# Patient Record
Sex: Female | Born: 1965 | Race: White | Hispanic: No | Smoking: Former smoker
Health system: Southern US, Community
[De-identification: ages and names within clinical notes are randomized; demographics above are authoritative.]

## PROBLEM LIST (undated history)

## (undated) DIAGNOSIS — Z8719 Personal history of other diseases of the digestive system: Secondary | ICD-10-CM

## (undated) DIAGNOSIS — E782 Mixed hyperlipidemia: Secondary | ICD-10-CM

## (undated) DIAGNOSIS — Z789 Other specified health status: Secondary | ICD-10-CM

## (undated) DIAGNOSIS — K219 Gastro-esophageal reflux disease without esophagitis: Secondary | ICD-10-CM

## (undated) DIAGNOSIS — Z973 Presence of spectacles and contact lenses: Secondary | ICD-10-CM

## (undated) DIAGNOSIS — G8929 Other chronic pain: Secondary | ICD-10-CM

## (undated) DIAGNOSIS — F32A Depression, unspecified: Secondary | ICD-10-CM

## (undated) DIAGNOSIS — IMO0001 Reserved for inherently not codable concepts without codable children: Secondary | ICD-10-CM

## (undated) DIAGNOSIS — M1990 Unspecified osteoarthritis, unspecified site: Secondary | ICD-10-CM

## (undated) DIAGNOSIS — M549 Dorsalgia, unspecified: Secondary | ICD-10-CM

## (undated) DIAGNOSIS — I10 Essential (primary) hypertension: Secondary | ICD-10-CM

## (undated) DIAGNOSIS — E039 Hypothyroidism, unspecified: Secondary | ICD-10-CM

## (undated) DIAGNOSIS — K449 Diaphragmatic hernia without obstruction or gangrene: Secondary | ICD-10-CM

## (undated) DIAGNOSIS — D071 Carcinoma in situ of vulva: Secondary | ICD-10-CM

## (undated) DIAGNOSIS — E781 Pure hyperglyceridemia: Secondary | ICD-10-CM

## (undated) DIAGNOSIS — M797 Fibromyalgia: Secondary | ICD-10-CM

## (undated) DIAGNOSIS — F329 Major depressive disorder, single episode, unspecified: Secondary | ICD-10-CM

## (undated) DIAGNOSIS — R0602 Shortness of breath: Secondary | ICD-10-CM

## (undated) HISTORY — PX: LUMBAR FUSION: SHX111

## (undated) HISTORY — PX: CARPAL TUNNEL RELEASE: SHX101

## (undated) HISTORY — PX: ANTERIOR CERVICAL DECOMP/DISCECTOMY FUSION: SHX1161

## (undated) HISTORY — PX: CERVICAL FUSION: SHX112

## (undated) HISTORY — DX: Essential (primary) hypertension: I10

## (undated) HISTORY — DX: Carcinoma in situ of vulva: D07.1

## (undated) HISTORY — PX: ABDOMINAL HYSTERECTOMY: SHX81

## (undated) HISTORY — PX: CHOLECYSTECTOMY: SHX55

## (undated) HISTORY — PX: VAGINAL HYSTERECTOMY: SUR661

## (undated) HISTORY — PX: PARTIAL HYSTERECTOMY: SHX80

## (undated) HISTORY — PX: ULNAR TUNNEL RELEASE: SHX820

## (undated) HISTORY — PX: HEMORROIDECTOMY: SUR656

## (undated) HISTORY — DX: Hypothyroidism, unspecified: E03.9

## (undated) HISTORY — PX: BACK SURGERY: SHX140

## (undated) HISTORY — PX: POSTERIOR LUMBAR FUSION: SHX6036

## (undated) HISTORY — PX: LAMINECTOMY AND MICRODISCECTOMY LUMBAR SPINE: SHX1913

## (undated) HISTORY — PX: LUMBAR SPINE HARDWARE REMOVAL: SHX1987

---
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
## 1998-01-24 ENCOUNTER — Inpatient Hospital Stay (HOSPITAL_COMMUNITY): Admission: AD | Admit: 1998-01-24 | Discharge: 1998-01-24 | Payer: Self-pay | Admitting: Obstetrics & Gynecology

## 1999-10-27 ENCOUNTER — Other Ambulatory Visit: Admission: RE | Admit: 1999-10-27 | Discharge: 1999-10-27 | Payer: Self-pay | Admitting: Gynecology

## 2000-05-12 ENCOUNTER — Inpatient Hospital Stay (HOSPITAL_COMMUNITY): Admission: AD | Admit: 2000-05-12 | Discharge: 2000-05-12 | Payer: Self-pay | Admitting: *Deleted

## 2000-05-18 ENCOUNTER — Inpatient Hospital Stay (HOSPITAL_COMMUNITY): Admission: AD | Admit: 2000-05-18 | Discharge: 2000-05-20 | Payer: Self-pay | Admitting: Obstetrics and Gynecology

## 2000-05-21 ENCOUNTER — Encounter: Admission: RE | Admit: 2000-05-21 | Discharge: 2000-06-22 | Payer: Self-pay | Admitting: Obstetrics and Gynecology

## 2001-06-15 ENCOUNTER — Other Ambulatory Visit: Admission: RE | Admit: 2001-06-15 | Discharge: 2001-06-15 | Payer: Self-pay | Admitting: Family Medicine

## 2002-07-21 ENCOUNTER — Other Ambulatory Visit: Admission: RE | Admit: 2002-07-21 | Discharge: 2002-07-21 | Payer: Self-pay | Admitting: Family Medicine

## 2002-08-10 HISTORY — PX: HEMORROIDECTOMY: SUR656

## 2002-08-10 HISTORY — PX: LAPAROSCOPIC CHOLECYSTECTOMY: SUR755

## 2002-09-07 ENCOUNTER — Ambulatory Visit (HOSPITAL_COMMUNITY): Admission: RE | Admit: 2002-09-07 | Discharge: 2002-09-07 | Payer: Self-pay | Admitting: Obstetrics & Gynecology

## 2002-09-07 ENCOUNTER — Encounter: Payer: Self-pay | Admitting: Obstetrics & Gynecology

## 2002-12-01 ENCOUNTER — Encounter (INDEPENDENT_AMBULATORY_CARE_PROVIDER_SITE_OTHER): Payer: Self-pay

## 2002-12-01 ENCOUNTER — Inpatient Hospital Stay (HOSPITAL_COMMUNITY): Admission: RE | Admit: 2002-12-01 | Discharge: 2002-12-03 | Payer: Self-pay | Admitting: Obstetrics & Gynecology

## 2002-12-05 ENCOUNTER — Ambulatory Visit: Admission: RE | Admit: 2002-12-05 | Discharge: 2002-12-05 | Payer: Self-pay | Admitting: Obstetrics & Gynecology

## 2003-05-16 ENCOUNTER — Encounter: Admission: RE | Admit: 2003-05-16 | Discharge: 2003-06-15 | Payer: Self-pay | Admitting: Orthopaedic Surgery

## 2003-08-07 ENCOUNTER — Encounter: Admission: RE | Admit: 2003-08-07 | Discharge: 2003-09-25 | Payer: Self-pay | Admitting: Neurology

## 2003-11-08 ENCOUNTER — Ambulatory Visit (HOSPITAL_COMMUNITY): Admission: RE | Admit: 2003-11-08 | Discharge: 2003-11-08 | Payer: Self-pay | Admitting: Orthopedic Surgery

## 2003-11-28 ENCOUNTER — Encounter: Admission: RE | Admit: 2003-11-28 | Discharge: 2003-12-27 | Payer: Self-pay | Admitting: Orthopedic Surgery

## 2004-01-11 ENCOUNTER — Encounter
Admission: RE | Admit: 2004-01-11 | Discharge: 2004-04-10 | Payer: Self-pay | Admitting: Physical Medicine & Rehabilitation

## 2004-04-10 ENCOUNTER — Encounter
Admission: RE | Admit: 2004-04-10 | Discharge: 2004-07-09 | Payer: Self-pay | Admitting: Physical Medicine & Rehabilitation

## 2004-04-15 ENCOUNTER — Ambulatory Visit: Payer: Self-pay | Admitting: Anesthesiology

## 2004-05-05 ENCOUNTER — Encounter: Admission: RE | Admit: 2004-05-05 | Discharge: 2004-07-09 | Payer: Self-pay | Admitting: Family Medicine

## 2004-07-23 ENCOUNTER — Encounter: Admission: RE | Admit: 2004-07-23 | Discharge: 2004-07-23 | Payer: Self-pay | Admitting: Orthopedic Surgery

## 2004-08-10 HISTORY — PX: CARPAL TUNNEL RELEASE: SHX101

## 2004-08-13 ENCOUNTER — Encounter: Admission: RE | Admit: 2004-08-13 | Discharge: 2004-08-13 | Payer: Self-pay | Admitting: Orthopedic Surgery

## 2005-01-27 ENCOUNTER — Emergency Department (HOSPITAL_COMMUNITY): Admission: EM | Admit: 2005-01-27 | Discharge: 2005-01-28 | Payer: Self-pay | Admitting: *Deleted

## 2005-04-21 ENCOUNTER — Ambulatory Visit: Payer: Self-pay | Admitting: Family Medicine

## 2005-05-14 ENCOUNTER — Encounter (HOSPITAL_COMMUNITY): Admission: RE | Admit: 2005-05-14 | Discharge: 2005-06-13 | Payer: Self-pay | Admitting: Rheumatology

## 2005-05-21 ENCOUNTER — Encounter (HOSPITAL_COMMUNITY): Admission: RE | Admit: 2005-05-21 | Discharge: 2005-06-20 | Payer: Self-pay | Admitting: Family Medicine

## 2005-06-03 ENCOUNTER — Ambulatory Visit: Payer: Self-pay | Admitting: Family Medicine

## 2005-09-03 ENCOUNTER — Encounter: Admission: RE | Admit: 2005-09-03 | Discharge: 2005-09-03 | Payer: Self-pay | Admitting: Neurosurgery

## 2005-09-21 ENCOUNTER — Inpatient Hospital Stay (HOSPITAL_COMMUNITY): Admission: RE | Admit: 2005-09-21 | Discharge: 2005-09-22 | Payer: Self-pay | Admitting: Neurosurgery

## 2005-11-02 ENCOUNTER — Ambulatory Visit: Payer: Self-pay | Admitting: Family Medicine

## 2006-03-08 ENCOUNTER — Encounter: Admission: RE | Admit: 2006-03-08 | Discharge: 2006-04-20 | Payer: Self-pay | Admitting: Orthopedic Surgery

## 2006-04-29 ENCOUNTER — Ambulatory Visit: Payer: Self-pay | Admitting: Family Medicine

## 2006-05-11 ENCOUNTER — Ambulatory Visit: Payer: Self-pay | Admitting: Family Medicine

## 2006-06-01 ENCOUNTER — Ambulatory Visit: Payer: Self-pay | Admitting: Family Medicine

## 2006-06-21 ENCOUNTER — Ambulatory Visit: Payer: Self-pay | Admitting: Family Medicine

## 2006-07-11 ENCOUNTER — Emergency Department (HOSPITAL_COMMUNITY): Admission: EM | Admit: 2006-07-11 | Discharge: 2006-07-11 | Payer: Self-pay | Admitting: Emergency Medicine

## 2006-07-12 ENCOUNTER — Emergency Department (HOSPITAL_COMMUNITY): Admission: EM | Admit: 2006-07-12 | Discharge: 2006-07-12 | Payer: Self-pay | Admitting: Emergency Medicine

## 2006-08-23 ENCOUNTER — Emergency Department (HOSPITAL_COMMUNITY): Admission: EM | Admit: 2006-08-23 | Discharge: 2006-08-23 | Payer: Self-pay | Admitting: Emergency Medicine

## 2006-09-03 ENCOUNTER — Emergency Department (HOSPITAL_COMMUNITY): Admission: EM | Admit: 2006-09-03 | Discharge: 2006-09-03 | Payer: Self-pay | Admitting: Emergency Medicine

## 2006-09-20 ENCOUNTER — Encounter: Admission: RE | Admit: 2006-09-20 | Discharge: 2006-09-20 | Payer: Self-pay | Admitting: Orthopedic Surgery

## 2006-10-16 ENCOUNTER — Ambulatory Visit (HOSPITAL_COMMUNITY): Admission: RE | Admit: 2006-10-16 | Discharge: 2006-10-16 | Payer: Self-pay | Admitting: Neurosurgery

## 2006-11-28 ENCOUNTER — Encounter: Admission: RE | Admit: 2006-11-28 | Discharge: 2006-11-28 | Payer: Self-pay | Admitting: Neurosurgery

## 2006-12-08 ENCOUNTER — Inpatient Hospital Stay (HOSPITAL_COMMUNITY): Admission: RE | Admit: 2006-12-08 | Discharge: 2006-12-13 | Payer: Self-pay | Admitting: Neurosurgery

## 2007-01-04 ENCOUNTER — Emergency Department (HOSPITAL_COMMUNITY): Admission: EM | Admit: 2007-01-04 | Discharge: 2007-01-04 | Payer: Self-pay | Admitting: Family Medicine

## 2007-01-11 ENCOUNTER — Encounter: Admission: RE | Admit: 2007-01-11 | Discharge: 2007-01-11 | Payer: Self-pay | Admitting: Neurosurgery

## 2007-04-20 ENCOUNTER — Emergency Department (HOSPITAL_COMMUNITY): Admission: EM | Admit: 2007-04-20 | Discharge: 2007-04-20 | Payer: Self-pay | Admitting: Emergency Medicine

## 2007-05-11 ENCOUNTER — Emergency Department (HOSPITAL_COMMUNITY): Admission: EM | Admit: 2007-05-11 | Discharge: 2007-05-11 | Payer: Self-pay | Admitting: Emergency Medicine

## 2007-05-31 ENCOUNTER — Encounter: Admission: RE | Admit: 2007-05-31 | Discharge: 2007-05-31 | Payer: Self-pay | Admitting: Neurosurgery

## 2007-09-29 ENCOUNTER — Encounter: Admission: RE | Admit: 2007-09-29 | Discharge: 2007-09-29 | Payer: Self-pay | Admitting: Neurosurgery

## 2007-12-25 ENCOUNTER — Emergency Department (HOSPITAL_COMMUNITY): Admission: EM | Admit: 2007-12-25 | Discharge: 2007-12-25 | Payer: Self-pay | Admitting: Emergency Medicine

## 2008-07-08 ENCOUNTER — Emergency Department (HOSPITAL_COMMUNITY): Admission: EM | Admit: 2008-07-08 | Discharge: 2008-07-08 | Payer: Self-pay | Admitting: Emergency Medicine

## 2008-07-24 ENCOUNTER — Encounter: Admission: RE | Admit: 2008-07-24 | Discharge: 2008-07-24 | Payer: Self-pay | Admitting: Neurosurgery

## 2008-08-10 HISTORY — PX: ULNAR TUNNEL RELEASE: SHX820

## 2009-02-25 ENCOUNTER — Emergency Department (HOSPITAL_COMMUNITY): Admission: EM | Admit: 2009-02-25 | Discharge: 2009-02-25 | Payer: Self-pay | Admitting: Emergency Medicine

## 2009-02-27 ENCOUNTER — Encounter
Admission: RE | Admit: 2009-02-27 | Discharge: 2009-02-27 | Payer: Self-pay | Admitting: Physical Medicine and Rehabilitation

## 2009-09-26 ENCOUNTER — Encounter: Admission: RE | Admit: 2009-09-26 | Discharge: 2009-09-26 | Payer: Self-pay | Admitting: Neurosurgery

## 2009-10-02 ENCOUNTER — Inpatient Hospital Stay (HOSPITAL_COMMUNITY): Admission: RE | Admit: 2009-10-02 | Discharge: 2009-10-03 | Payer: Self-pay | Admitting: Neurosurgery

## 2010-08-30 ENCOUNTER — Encounter: Payer: Self-pay | Admitting: General Surgery

## 2010-08-31 ENCOUNTER — Encounter: Payer: Self-pay | Admitting: Neurosurgery

## 2010-08-31 ENCOUNTER — Encounter: Payer: Self-pay | Admitting: Orthopedic Surgery

## 2010-10-29 LAB — CBC
Platelets: 154 10*3/uL (ref 150–400)
WBC: 9.4 10*3/uL (ref 4.0–10.5)

## 2010-10-29 LAB — SURGICAL PCR SCREEN: Staphylococcus aureus: NEGATIVE

## 2010-11-16 LAB — URINALYSIS, ROUTINE W REFLEX MICROSCOPIC
Protein, ur: NEGATIVE mg/dL
Urobilinogen, UA: 0.2 mg/dL (ref 0.0–1.0)

## 2010-12-26 NOTE — Discharge Summary (Signed)
   NAME:  [name], [name]                         ACCOUNT NO.:  [id number]   MEDICAL RECORD NO.:  [id number]                   PATIENT TYPE:  INP   LOCATION:  9134                                 FACILITY:  [facility]   PHYSICIAN:  Roseanna Rainbow, M.D.         DATE OF BIRTH:  1965-10-22   DATE OF ADMISSION:  12/01/2002  DATE OF DISCHARGE:  12/03/2002                                 DISCHARGE SUMMARY   CHIEF COMPLAINT:  The patient is a 45 year old Caucasian female with  secondary dysmenorrhea refractive to medical management who presents for a  diagnostic laparoscopy and total vaginal hysterectomy.  Please see the  dictated History and Physical for further details.   HOSPITAL COURSE:  The patient was admitted, underwent a diagnostic  laparoscopy and total vaginal hysterectomy.  Please see the dictated  Operative Summary for further details.  Her postoperative course was  uneventful and she was discharged to home on postoperative day #2.   DISCHARGE DIAGNOSIS:  Secondary dysmenorrhea with adenomyosis.   PROCEDURE:  Diagnostic laparoscopy and total vaginal hysterectomy.   CONDITION:  Stable.   DIET:  Regular.   ACTIVITY:  Per the instruction booklet.   MEDICATIONS:  1. Resume home medications.  2. Percocet one to two tablets q.4-6h. as needed.  3. Colace one tablet p.o. daily as needed.   DISPOSITION:  The patient will follow up in the office in six weeks.                                               Roseanna Rainbow, M.D.    [name]  D:  01/23/2003  T:  01/23/2003  Job:  [id number]

## 2010-12-26 NOTE — Group Therapy Note (Signed)
MEDICAL RECORD NUMBER:  [id number]   DATE OF BIRTH:  05-Jul-1966   HISTORY:  A 45 year old female who has had some intermittent neck and back  pain over the years, but noticed increasing problems with pain shooting down  the last arm starting last summer.  The patient was seen by her primary care  physician and sent over to neurology.  She had neurological evaluation for  left arm pain, neck pain, and tingling with an EMG ordered.  I do not have  the results of the EMG.  Cervical spine MRI did demonstrate left C6-7 disk  impinging on C7 nerve root.  There was some facet arthropathy at that level  with disk osteophyte complex.  She was treated with Ultram which has been  helpful for her pain and also with ibuprofen and Flexeril.  She has gone  through physical therapy, the last time about two to three months ago.  She  has had this in regards to her neck pain primarily and this has helped some  of the shooting pains down the arm.  In fact, this is more of an  intermittent problem.  Her bigger concern at this point is her back pain,  which she notes mainly when she is lifting and carrying her toddler, lifting  up feed bags, gardening, and vacuuming.  She has a very active lifestyle.  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  She does gardening, as well as lifting feed bags, feeding geese  and chickens, as well as several dogs.   MRI was done on November 08, 2003.  It showed minimal disk bulge at L5-S1,  facet degeneration, left greater than right, and also some L4-5 degenerative  disk disease and facet arthropathy at that level.   She was also seen by Dr. August Saucer for a right lateral epicondylitis that  responded well to injection.  She also received Ultram.  She also had a  hamstring tear on the left after wrestling around with her boyfriend.   Her past medical history is significant for a hysterectomy for endometriosis  and adenomyosis in April  of 2004.  She has had septic gallbladder with  cholecystectomy in June of 2004 and hemorrhoidectomy in November of 2004.  The hemorrhoids came on while pregnant.   Going back to her back pain, she thought that this came on the first time  during pregnancy.   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  Fannie Knee from orthopedics.  She has had a left wrist injury in the past and has  occasional left wrist pain.   FAMILY MEDICAL HISTORY:  Significant for cancer in father and sister and  diabetes in aunt and cousin.  Her father died of bladder cancer.  A sister  has leukemia.   MEDICATIONS:  She ran out of Ultram a couple of weeks and really missed this  after she planted her garden.  She continues on Flexeril 10 mg t.i.d. and  ibuprofen 600 mg b.i.d. and p.r.n.  She takes multivitamins.   ALLERGIES:  1. ULTRACET.  2. DARVOCET.  3. GRASS.  4. DUST.  5. POLLEN.  6. RABBIT HAIR.  7. MILDEW.   DRUG SENSITIVITIES:  GI upset with Volteran, excessive ibuprofen, Naprosyn,  aspirin, Aleve, Orudis, and Anaprox.   CURRENT PAIN LEVEL:  Ranging from 5-7/10.   VOCATIONAL:  She last was employed in December of 2003.  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  Since August, she has  been enrolled in classes in medical terminology.   REVIEW OF SYSTEMS:  Positive for occasional reflux, heartburn, constipation,  weakness in the left arm greater than right with numbness.  This is  intermittent.  Spasms in the legs.  This is intermittent.  Anxiety  occasionally.  Poor sleep.  No depression or suicidal thoughts.   PHYSICAL EXAMINATION:  Blood pressure 123/78, pulse 119, but this was done  right after she was late running over to this appointment, respirations 24,  O2 saturation 99% on room air.   In no acute distress.  Mood and affect appropriate.  Her neck has full range  of motion.  Spurling sign is negative.  She has mild tenderness in the  upper  trapezius.  She has full strength, 5/5, in bilateral deltoids, biceps,  triceps, and grip, as well as hip flexion, knee extension, and ankle  dorsiflexion.   She has full range of motion in bilateral upper and lower extremities.  She  has tenderness to palpation in the right lateral epicondyle, but otherwise  no pain to palpation in bilateral upper and lower extremities.  She has  normal sensation in bilateral upper extremities, except for the first dorsal  web space on the left side.  The middle finger is difficult to evaluate on  the left side because of a wasp sting from yesterday causing  hypersensitivity to touch.   The lower extremities have equal sensation.   The back has good forward flexion of 75% with no pain, but coming back up  she has pain in the PSIS area.  She does have pain to palpation in the L5  paraspinals, as well as right PSIS and right gluteus medius.   IMPRESSION:  1. Cervicalgia with radiculopathy.  This is intermittent and generally     improved after physical therapy.  She did well with traction and may     benefit from a home traction unit.  Will send physical therapy for this.  2. Chronic low back pain.  Evidence of degenerative disk disease.  May have     left SI joint arthropathy as well given that this started during     pregnancy and is centered at the PSIS area and left buttock.  Could     benefit from diagnostic left SI joint injection.  3. History of right lateral epicondylitis.  This is generally better, just     tender to palpation, and occasionally with prolonged writing.  4. Insomnia related to low back pain.   PLAN:  1. As noted above, will send to the [facility] outpatient therapy satellite     in [place], [place], to evaluate for cervical traction home unit.  2. Left SI joint injection under fluoroscopic guidance.  3. Resume Ultram 50 mg one p.o. t.i.d. 4. Lidoderm patch over left PSIS area on q.h.s. and off q.a.m.  5.  Consider hydrocodone one q.h.s. given that Ultram causes the patient to     sleep more poorly if taken at h.s.  6. Consider gabapentin, although she has tried this in the past with some     sedation side effect.  7. I will see her back in approximately two weeks.     Erick Colace, M.D.   AEK/MedQ  D:  01/14/2004 16:58:03  T:  01/14/2004 18:58:07  Job #:  [id number]   cc:   G.  Dorene Grebe, M.D.  Fax: [phone]   Gustavus Messing. Orlin Hilding, M.D.  [address]  [address]  [place]  [place] [address]  Fax: [phone]   Magnus Sinning. Dimple Casey, M.D.  [address] [place]  [place] [address]  Fax: [phone]   [facility] [place], [place]

## 2010-12-26 NOTE — Discharge Summary (Signed)
NAMEJALIYA, [name]               ACCOUNT NO.:  [id number]   MEDICAL RECORD NO.:  [id number]          PATIENT TYPE:  INP   LOCATION:  3014                         FACILITY:  [facility]   PHYSICIAN:  Donalee Citrin, M.D.        DATE OF BIRTH:  02-09-1966   DATE OF ADMISSION:  12/08/2006  DATE OF DISCHARGE:  12/13/2006                               DISCHARGE SUMMARY   ADMITTING DIAGNOSIS:  Degenerative disk disease L4-5.   PROCEDURE:  Posterior lumbar interbody fusion L4-5.   HOSPITAL COURSE:  The patient was admitted and on the same day went to  the operating room and underwent the aforementioned procedure.  Postoperatively the patient did very well.  Had a lot of difficulty with  pain control, mostly in her back.  Her leg pain was significantly  improved.  On the floor, the patient continued to have some difficulty  mobilizing and was very slow to mobilize with pain control.  However,  over the next couple of days with physical increase and therapy, her  pain became much better.  It was controlled on oral narcotics, as well  muscle relaxers, one she was weaned off her IV pain medication.  Her  Foley was able to be taken out.  She was voiding spontaneously.  Her  drain was discontinued.  She was able to be discharged home on hospital  day #6 with scheduled followup in 2 weeks.           ______________________________  Donalee Citrin, M.D.     GC/MEDQ  D:  01/24/2007  T:  01/24/2007  Job:  [id number]

## 2010-12-26 NOTE — Op Note (Signed)
NAMEDELMI, FULFER NO.:  [id number]   MEDICAL RECORD NO.:  [id number]          PATIENT TYPE:  INP   LOCATION:  2899                         FACILITY:  [facility]   PHYSICIAN:  Donalee Citrin, M.D.        DATE OF BIRTH:  12/16/1965   DATE OF PROCEDURE:  12/08/2006  DATE OF DISCHARGE:                               OPERATIVE REPORT   PREOPERATIVE DIAGNOSIS:  Degenerative disk disease with history of  mechanical low back pain, L4-L5, with bilateral L4 and L5 radiculopathy.   PROCEDURE PERFORMED:  Decompressive laminectomy and posterior lumbar  interbody fusion, L4-L5, with hybrid Telamon PEEK 8 x 22 cage packed  with locally harvested autograft mixed with Progenics bone substitute as  well as a Tangent 8 x 26 mm allograft wedge; pedicle screw fixation, L4-  L5, using the 6.35 Legacy pedicle screw system; posterolateral  arthrodesis, L4-L5, using locally harvested allograft mixed with  Progenics bone substitute.   SURGEON:  Donalee Citrin, M.D.   ANESTHESIA:  General endotracheal.   HISTORY OF PRESENT ILLNESS:  The patient is a 45 year old female who has  had longstanding back and bilateral leg pain radiating to the front of  her shin as well as the top of the foot and the big toe.  This has been  refractory to all forms of conservative treatment and has been  progressive over the last several years.  She had back pain much greater  than leg pain.  She had an MRI scan that showed motor changes at L4-L5,  severe degenerative collapse, central disk herniation, and biforaminal  stenosis of the L4 and L5 nerve roots.  Due to the patient's failure of  conservative treatment, MRI scan findings and physical examination, the  patient was recommended decompressive laminectomy and posterior lumbar  interbody fusion at this level.  The risks and benefits of above have  been explained to the patient and she agreed to proceed forward.   DESCRIPTION OF PROCEDURE:  The patient was brought  to the operating  room, was induced under general anesthesia and positioned prone.  Once  back was prepped and draped in the usual fashion and once we localized  the L4-L5 disk space, a midline incision was made after infiltration  with 10 mL lidocaine with epinephrine.  Bovie electrocautery was used to  separate out the dissection at the level of L4 and L5, exposing the TPs  at L4 and L5 bilaterally.  Intraoperative x-ray confirmed localization  at the appropriate level.  Then complete central decompression and  complete medial facetectomies were performed with the Leksell rongeur  and 3 and 4 mm Kerrison punch.  There was a marked facet arthropathy and  diastasis of the facet joints causing marked stenosis on predominantly  the 5 root, but also superiorly in 4 root.  The 4 root was noted to be  markedly stenotic due to degenerative collapse.  This was all  underbitten.  After complete medial facetectomies, both foraminotomies  on both sides were completely decompressing the 4 and the 5 nerve roots.   Attention was first taken to  pedicle screw placement.  Using a high  speed drill fluoroscopy, pilot holes were drilled at L4 on the left,  cannulated with the awl, probed, tapped with a 5-5 tap, probed again,  and six 5 x 45 screws were inserted, starting on the left at L4.  This  was inspected from within the pedicles as well as within the canal.  Fluoroscopy confirmed position and trajectory, confirming no medial  lateral breaching of the pedicle at all.   At L5, the screw in the left was inserted in similar fashion, as well as  the L4 and L5 screws on the right, with six 5 x 40 screws inserted at L5  and 6 x 45s at L4.  After all points of _________ interbody work, the  disk was noted to be remarkably collapsed, barely accepting the #15  blade scalpel.  A _______ to reflect the right L5 nerve root medially.  Then a #7 distractor was used to enter the disk space and distract it  open.   The disk was then cleaned out from the left side.  Then a #8  distractor was inserted on the left side, removing the #7 in the right.  Fluoroscopy confirmed this to be appropriate sizing for the graft.  So  two grafts were opened with an 8 x 22 Telamon cage and an 8 x 26 Tangent  allograft wedge.  Using a size 8 cutter and chisel, the interbody was  prepared on the right side.  The Tangent allograft was inserted on the  right side.  Then the distractor was removed from the left.  Again, in a  similar fashion, the interbody space was cleaned out.  Several large  fibrous disks were removed from the central compartment as well as the  lateral compartment underneath the four roots.   At the end of the diskectomy, locally harvested autograft was packed  against the right-sided Tangent.  Then the left-sided Telamon cage  packed with Progenics mixed with locally harvested autograft was  inserted in the left side, both 2-3 mm deep to the posterior vertebra  bilaterally.  After all the interbody work had been done and placement  was confirmed with fluoroscopy, the wound was copiously irrigated.  Meticulous hemostasis was obtained.  Aggressive decortication was  carried out at the TPs and lateral gutters at L4-L5.  Locally harvested  autograft was packed in the lateral gutters on the outsides of the  screws.  Then 4 mm rods were inserted, the _______ was tightened down at  L5.  The L4 pedicle screws were compressed against L5.  A 4 x 22  crosslink was then inserted.  The neuroforamina were all re-explored  prior to placing the crosslink and noted to be widely patent and  confirmed no migration of graft material in the interspace.  Then, after  the crosslink had been inserted, Gelfoam was on laid on top of the dura.  A medium Hemovac drain was threaded under the crosslink.  Posterior  fluoroscopy confirmed good position of rods, screws and bone grafts. Then the wound was closed in layers with  interrupted Vicryl; and the  skin was closed with running 4-0 subcuticular benzoin and Steri-Strips.  The patient was transported to the recovery room in stable condition.  At the end of the case, instrument and sponge counts were correct.           ______________________________  Donalee Citrin, M.D.     GC/MEDQ  D:  12/08/2006  T:  12/08/2006  Job:  [id number]

## 2010-12-26 NOTE — Procedures (Signed)
NAME:  [name], [name]                         ACCOUNT NO.:  [id number]   MEDICAL RECORD NO.:  [id number]                   PATIENT TYPE:   LOCATION:                                       FACILITY:  [facility]   PHYSICIAN:  Erick Colace, M.D.           DATE OF BIRTH:  [birth year]   DATE OF PROCEDURE:  04/08/2004  DATE OF DISCHARGE:                                 OPERATIVE REPORT   PROCEDURE:  Left sacroiliac joint injection.   PHYSICIAN:  Erick Colace, M.D.   INDICATIONS FOR PROCEDURE:  Left buttock pain, positive fabere, SI joint  area.  Previous temporary relief with left SI joint injection.   INFORMED CONSENT:  An informed consent was obtained, after a discussion of  the risks and benefits of the procedure with the patient.  She elects to  proceed.   DESCRIPTION OF PROCEDURE:  The patient is placed prone on the fluoroscopy  table.  A Betadine prep and sterile drape.  A 1.25 inch, #25 gauge needle  was used to anesthetize the skin and subcutaneous tissues with 1% lidocaine  x3 mL.  Then a #22 gauge, 3.5 inch spinal needle was inserted under  fluoroscopic guidance into the left SI joint.  Then 0.5 mL of Omnipaque 180  demonstrated the joint.  Then a solution containing 0.5 mL of 2% lidocaine  and 0.5 mL of 40 mg per mL of Kenalog was injected.  The patient tolerated  the procedure well.  Post-injection instructions were given.  Medications were reviewed with the patient.  She received some from Dr. Burnard Bunting.  Discussed that she has a controlled substance agreement with  this clinic.  She agrees to abide.  She will hold off on her Medrol Dosepak  for one week, until after the effects of this injection are assessed.  I  have given her a prescription for Tylenol #3, #60, one p.o. b.i.d.                                                Erick Colace, M.D.    AEK/MEDQ  D:  04/08/2004 12:59:07  T:  04/08/2004 14:53:28  Job:  [id number]   cc:   G. Dorene Grebe,  M.D.  Fax: [phone]

## 2010-12-26 NOTE — Op Note (Signed)
NAMEKYIAH, CANEPA               ACCOUNT NO.:  [id number]   MEDICAL RECORD NO.:  [id number]          PATIENT TYPE:  OIB   LOCATION:  3013                         FACILITY:  [facility]   PHYSICIAN:  Donalee Citrin, M.D.        DATE OF BIRTH:  1966-05-26   DATE OF PROCEDURE:  09/21/2005  DATE OF DISCHARGE:                                 OPERATIVE REPORT   PREOPERATIVE DIAGNOSIS:  C6 radiculopathy, right and C7 radiculopathy, left  from cervical spondylosis with spondylitic compression of C6 and C7 nerve  roots respectively.   POSTOPERATIVE DIAGNOSIS:  C6 radiculopathy, right and C7 radiculopathy, left  from cervical spondylosis with spondylitic compression of C6 and C7 nerve  roots respectively.   OPERATION PERFORMED:  Anterior cervical diskectomy and fusion at C5-6 and C6-  7 using a 5 mm LifeNet wedge at C5-6 and 6 mm LifeNet wedge at C6-7 and a 40  mm Atlantis Vision plate and six 13 mm variable angle screws.   SURGEON:  Donalee Citrin, M.D.   ASSISTANT:  __________.   ANESTHESIA:  General endotracheal.   INDICATIONS FOR PROCEDURE:  The patient is a very pleasant 45 year old  female who has had longstanding neck and bilateral arm pain predominantly on  the left side on the first two fingers of the left hand but also  occasionally into the right arm and into the thumb.  Preoperative imaging  with MRI and myelography showed spondylitic compression of the C6 nerve root  on the right and C7 nerve root on the left.  The patient had failed all  forms of conservative treatment over the last several years with physical  therapy, anti-inflammatories, facet injections, and time.  The patient was  subsequently recommended anterior cervical diskectomy and fusion.  I  extensively went over the risks and benefits of surgery with her.  She  understands and agreed to proceed forward.   DESCRIPTION OF PROCEDURE:  The patient was brought to the operating room was  induced under general anesthesia,  placed supine with neck in slight  extension with five pounds of halter traction.  The right side of the neck  was prepped and draped in the usual sterile fashion after preoperative x-ray  localized the C6-7 disk space.  A curvilinear incision was made just off the  midline to anterior border of the sternocleidomastoid muscle and the  superficial layer of the platysmas was dissected out and divided  longitudinally.  The avascular plane between the sternocleidomastoid muscle  and the strap muscles was developed down to prevertebral fascia.  The  prevertebral fascia was incised with Kitners.  Then intraoperative x-ray  confirmed localization of the C5-6 disk space.  This was subsequently marked  with a 15 blade scalpel to remove the anterior margin of the annulus.  Then  the longus colli was reflected laterally and the self-retaining retractor  was placed.  Annulotomies were extended to C5-6 and placed in C6-7 and then  the anterior margin of the disk space was removed in piecemeal fashion with  pituitary rongeurs and upgoing curets.  Then using a 2  and 3 mm Kerrison  punches, large anterior osteophytes were bitten off the C5 and C6 vertebral  bodies and the end plates were squared off with a high speed drill down to  the posterior annulus and posterior longitudinal ligament.  At this point  the operating microscope was draped and brought into the field and under  microscopic illumination, first at C6-7 the posterior annulus was removed in  piecemeal fashion with 1 mm Kerrison punch and the osteophytic complex  coming off the posterior aspect of the C7 and C6 vertebral bodies were  underbitten exposing the posterior longitudinal ligament __________ thecal  sac.  At this point the osteophyte coming off the C5 vertebral body was  immediately visualized compressing the proximal aspect of the C7 neural  foramina on the left side.  This was all underbitten and removed and the C7  nerve root was  immediately identified, skeletonized out the foramen and  decompressed at the level of the pedicle.  The pedicle was then palpated  with a black nerve hook and neural foramen was widely opened up and  confirmed with hook.  Then the end plates were squared off marching to the  right side, decompressed the proximal right C7 neural foramen.  The end  plates were scraped with VA curet and Gelfoam was placed in the disk space.  Attention was turned to the disk space above at this point.  Again using  high speed drill, the posterior annulus and posterior longitudinal ligament  was drilled down.  Osteophytic complex __________ C5 vertebral body and C6  vertebral body were underbitten exposing the thecal sac and proximal left C6  nerve root.  Then attention was turned to the margin of the right, again  marked uncinate hypertrophy was noted to be compressing proximal aspect of  right C6 nerve root.  This was all underbitten and the C6 neural foramen was  identified and the nerve was skeletonized and decompressed.  Again the hook  was used to palpate the pedicle and confirmed location and significant  decompression of the proximal and distal neural foramen and this was  confirmed.  Then the end plates were scraped and underbitten.  The wound was  copiously irrigated. Meticulous hemostasis was maintained.  A 5 mm graft was  inserted 1 mm deep to anterior vertebral body line at 5-6 and a 6 mm graft  at C6-7.  Then a 40 mm Atlantis Vision plate was sized and inserted with six  13 mm variable angle screws which were drilled, tapped and placed, all  screws had excellent purchase.  Set screws tightened down.  Postoperative  fluoroscopy confirmed good position of plate, screws and bone grafts.  Then  meticulous hemostasis was maintained.  Wound was copiously irrigated.  The  platysma was reapproximated with 3-0 interrupted Vicryl and the skin was closed with running 4-0 subcuticular.  Benzoin and  Steri-Strips were  applied.  The patient was then transferred to the recovery room in stable  condition.  At the end of the case, sponge, needle and instrument counts  were correct.           ______________________________  Donalee Citrin, M.D.     GC/MEDQ  D:  09/21/2005  T:  09/22/2005  Job:  [id number]

## 2010-12-26 NOTE — H&P (Signed)
NAME:  [name], [name]                         ACCOUNT NO.:  [id number]   MEDICAL RECORD NO.:  [id number]                   PATIENT TYPE:  INP   LOCATION:  NA                                   FACILITY:  [facility]   PHYSICIAN:  Roseanna Rainbow, M.D.         DATE OF BIRTH:   DATE OF ADMISSION:  11/22/2002  DATE OF DISCHARGE:                                HISTORY & PHYSICAL   CHIEF COMPLAINT:  The patient is a 45 year old Caucasian female with  secondary dysmenorrhea refractory to attempts at medical management who  presents for diagnostic laparoscopy and total vaginal hysterectomy.   HISTORY OF PRESENT ILLNESS:  Her menstrual periods are described as regular.  She has had severe dysmenorrhea which was not helped by medication and  interfered with normal activities.  The flow is described as heavy.  Workup  to date has included a pelvic ultrasound from 1/04 that demonstrated one  focal myoma and the possibility of adenomyosis.  The sagittal length of the  uterus is 9 cm.  A Pap smear from 12/03 was normal.  The patient had a  several month course of attempt at medical management with NuvaRing and she  elected to proceed with surgery.   PAST OB GYN HISTORY:  She is sexually active heterosexual.  Has had 20+  lifetime sexual partners.  She has a history of HPV.  She has been pregnant  five times.  She has four living children.  This is history of one  miscarriage or abortion. All deliveries were spontaneous vaginal deliveries.   PAST MEDICAL HISTORY:  Arthritis and duodenitis secondary to nonsteroidal  use.  Asthma and gastroesophageal reflux disease.   SOCIAL HISTORY:  She is divorced homemaker.  Tobacco use:  Stopped using  cigarettes several years ago.  Alcohol:  Drinks minimal amount of alcohol.  Caffeine:  Minimal amount of caffeinated beverages daily.  Illicit drug use:  Denies illicit drug use.   FAMILY HISTORY:  Remarkable for leukemia, colon cancer and hypertension.   REVIEW OF SYSTEMS:  GENERAL:  Decreased energy level.   MEDICATIONS:  1. Prevacid.  2. Tylenol.  3. Flonase.   ALLERGIES:  No known drug allergies.  However, she did have a duodenitis  secondary to nonsteroidal use.   PHYSICAL EXAMINATION:  VITAL SIGNS:  Height 5 feet 6 inches, weight 190  pounds.  Temperature 98.5, pulse 88, blood pressure 130/75, respiratory rate  20.  GENERAL:  Constitutional:  Caucasian female who appears stated age.  Mildly  overweight body habitus in no acute distress.  LUNGS:  Clear to auscultation bilaterally.  HEART:  Regular  rate and rhythm.  ABDOMEN:  Soft, nontender without masses. Bowel sounds active.  PELVIC:  The external female genitalia are normal appearing.  On speculum  exam the vagina is clean.  Bimanual exam:  The uterus is anteverted,  moderate tenderness noted.  The adnexa no masses or organomegaly or local  guarding.  EXTREMITIES:  No clubbing, cyanosis or edema.  SKIN:  Without rash.   ASSESSMENT:  Secondary dysmenorrhea and menorrhagia refractory to attempts  at medical management.  The risks, benefits and alternatives of surgery  reviewed with the patient.   PLAN:  The planned procedure is diagnostic laparoscopy and likely total  vaginal hysterectomy and possible total abdominal hysterectomy.  The patient  requests ovarian conservation even if there is evidence of endometriosis.                                               Roseanna Rainbow, M.D.    Judee Clara  D:  11/22/2002  T:  11/22/2002  Job:  [id number]

## 2010-12-26 NOTE — Procedures (Signed)
NAME:  [name], [name]                         ACCOUNT NO.:  [id number]   MEDICAL RECORD NO.:  [id number]                   PATIENT TYPE:   LOCATION:                                       FACILITY:  [facility]   PHYSICIAN:  Erick Colace, M.D.           DATE OF BIRTH:  24-Apr-1966   DATE OF PROCEDURE:  02/12/2004  DATE OF DISCHARGE:                                 OPERATIVE REPORT   INDICATIONS FOR PROCEDURE:  This 45 year old female with chronic low back  pain.  An MRI showed minimal disk bulge at L5-S1 with facet degeneration,  left greater than right, and some L4-5 degenerative disk disease with facet  arthropathy.   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   PHYSICIAN:  Erick Colace, M.D.   INFORMED CONSENT:  An informed consent was obtained after describing the  risks and benefits of the procedure with the patient.  She understands and  has given a written consent.   DESCRIPTION OF PROCEDURE:  The patient is placed prone on the fluoroscopy  table.  A Betadine prep and sterile drape.  Lidocaine 1% in the skin and the  subcutaneous tissues x3 mL using a #25 gauge 1.25 inch needle.  Then a #22  gauge 3.5 inch spinal needle with stylet was inserted under fluoroscopic  guidance in the left SI joint.  This was confirmed by both AP and lateral  imaging, and also by contrast enhanced injection under live fluoroscopy.  Then 1 mL of 1% mephobarbital-free lidocaine was injected.  The patient  tolerated the procedure well.  See the pre-injection and post-injection  sheets for vital signs and pain scores.                                                Erick Colace, M.D.    AEK/MEDQ  D:  02/12/2004 12:38:35  T:  02/12/2004 14:39:57  Job:  [id number]

## 2010-12-26 NOTE — Op Note (Signed)
NAMEMarland Kitchen  [name], [name]                         ACCOUNT NO.:  [id number]   MEDICAL RECORD NO.:  [id number]                   PATIENT TYPE:  INP   LOCATION:  9199                                 FACILITY:  [facility]   PHYSICIAN:  Roseanna Rainbow, M.D.         DATE OF BIRTH:  05-07-1966   DATE OF PROCEDURE:  12/01/2002  DATE OF DISCHARGE:                                 OPERATIVE REPORT   PREOPERATIVE DIAGNOSES:  1. Secondary dysmenorrhea refractory to attempts at medical management.  2. Small myomatous uterus, rule out adenomyosis.   POSTOPERATIVE DIAGNOSES:  1. Secondary dysmenorrhea refractory to attempts at medical management.  2. Small myomatous uterus, rule out adenomyosis.   PROCEDURE:  1. Diagnostic laparoscopy.  2. Total vaginal hysterectomy.   SURGEON:  Roseanna Rainbow, M.D., Bing Neighbors. Clearance Coots, M.D.   ANESTHESIA:  General endotracheal.   COMPLICATIONS:  None.   ESTIMATED BLOOD LOSS:  150 mL.   URINE OUTPUT:  300 mL clear urine at the end of the procedure.   FINDINGS:  At laparoscopy the uterus, tubes, and ovaries as well as the  peritoneal surfaces of the anterior and posterior cul-de-sac appeared  normal.   TECHNIQUE:  The patient was taken to the operating room where general  anesthesia was obtained without difficulty.  The patient was examined under  anesthesia and found to have a small anteverted uterus with normal adnexa.  She was then placed in a dorsal lithotomy position and prepped and draped in  the usual sterile fashion.  A bivalve speculum was then placed in the  patient's vagina and the anterior lip of the cervix grasped with a single  tooth tenaculum.  A Hulka manipulator was then advanced into the uterus and  secured to the anterior lip of the cervix as a means to manipulate the  uterus.  The single tooth tenaculum was removed as well as the speculum was  removed from the vagina.  Attention was then turned to the patient's abdomen  where a  10 mm skin incision was made in the umbilical fold.  The Veress  needle was carefully introduced into the peritoneal cavity at a 45 degree  angle while tenting the abdominal wall.  Intraperitoneal placement was  confirmed by use of a water-filled syringe and dropped in the intra-  abdominal pressure with insufflation of CO2 gas.  The trocar and sleeve were  then advanced without difficulty into the abdomen where intra-abdominal  placement was confirmed by the laparoscope.  The survey of the patient's  pelvis and abdomen revealed entirely normal anatomy.  At this point the  laparoscope was removed and the pneumoperitoneum was evacuated.  A weighted  speculum was placed into the vagina and the cervix was grasped with a Christella Hartigan  tenaculum.  The cervix was then injected anteriorly with 1% lidocaine with  1:200,000 of epinephrine.  The cervix was then incised anteriorly with a  scalpel and the bladder  dissected off the pubovesical cervical fascia  anteriorly with Metzenbaum scissors.  The anterior cul-de-sac was entered  sharply.  This same procedure was performed posteriorly and the posterior  cul-de-sac entered sharply without difficulty.  At this point a Heaney clamp  was placed over the uterosacral ligaments on either side.  These were then  transected and suture ligated with 0 Vicryl.  Hemostasis was assured.  The  cardinal ligaments were then clamped on both sides, transected, and suture  ligated in a similar fashion.  The uterine arteries and broad ligament were  then serially clamped with Heaney clamps, transected, and suture ligated on  both sides.  Excellent hemostasis was visualized.  Both cornu were clamped  with Heaney clamps, transected, and the uterus delivered.  These pedicles  were then suture ligated with excellent hemostasis.  The vaginal cuff angles  were closed with figure-of-eight sutures of 0 Vicryl on both sides.  The  uterosacral ligament pedicles were plicated in the  midline.  The remainder  of the vaginal cuff was closed with figure-of-eight sutures of 0 Vicryl in  an interrupted fashion.  All instruments were then removed from the vagina.  A pneumoperitoneum was then obtained with CO2 gas.  The laparoscope was then  advanced through the trocar and a survey of the patient's pelvis revealed  excellent hemostasis.  The instruments were then removed from the patient's  abdomen and the incision repaired with 3-0 Vicryl and Dermabond.  The  patient tolerated the procedure well.  Sponge, lap, and needle counts were  correct x2.  The patient was taken to the PACU awake, in stable condition.                                               Roseanna Rainbow, M.D.    Judee Clara  D:  12/01/2002  T:  12/01/2002  Job:  [id number]

## 2010-12-26 NOTE — Assessment & Plan Note (Signed)
The [facility] comes to [facility] today to evaluate and review  health and history form, 14-point review of systems.  Chaperoned visit, at  no time was the patient left unattended.   Problem 1.  Through pharmacy check, we have identified multiple providers of  controlled and uncontrolled substances.  Basically all analgesics.  We  discussed this with her and reviewed patient care agreement.  I am not sure  she clearly understood this, but she is also concerned about her son using  these medications, who is 15.  She has apparently activated primary care in  this regard, she is going to have him drug tested.  I do not think we need  to send DSS in at this time.  I did inform her that in fact we will have to  have DSS get involved if it appears that there is a risk environment.  At  this time, will follow her expectantly.   Problem 2.  Do not believe further imaging or diagnostics are warranted.  As  I review her MRI which she has with her and review her symptoms, and I have  discussed with Erick Colace, M.D., I believe that it is reasonable to  consider a cervical facetal injection, or possible cervical epidural.  We  will probably go with the facet injection.  I reviewed this with her.  Risks, complications, and options were fully outlined.  This would be to the  left side.   Problem 3.  Home-based therapy and otherwise __________ has been reviewed.   OBJECTIVE:  Diffuse paracervical myofascial discomfort and positive cervical  facetal compression test, left greater than right.  She has suprascapular  levator scapular pain and pain with extension.  No new neurological  findings, motor, sensory, or reflexic.   IMPRESSION:  Cervicalgia, degenerative disk disease of cervical spine, and  cervical facet syndrome.   PLAN:  Cervical facet injection under local anesthetic at C4, C5, C6, and  C7, independent needle axis points, left side.  We will do this in followup.  I  have reviewed this with her. Discharge instructions are given.  No  prescriptions are given at this time.      Celene Kras, MD   HH/MedQ  D:  04/15/2004 11:45:24  T:  04/15/2004 11:59:15  Job #:  [id number]

## 2011-02-03 ENCOUNTER — Ambulatory Visit
Admission: RE | Admit: 2011-02-03 | Discharge: 2011-02-03 | Disposition: A | Discharge status: Home / Self Care | Payer: Medicaid Other | Source: Ambulatory Visit | Attending: Neurosurgery | Admitting: Neurosurgery

## 2011-02-03 ENCOUNTER — Other Ambulatory Visit: Payer: Self-pay | Admitting: Neurosurgery

## 2011-02-03 DIAGNOSIS — M549 Dorsalgia, unspecified: Secondary | ICD-10-CM

## 2011-02-03 DIAGNOSIS — M542 Cervicalgia: Secondary | ICD-10-CM

## 2011-04-23 ENCOUNTER — Other Ambulatory Visit: Payer: Self-pay | Admitting: Neurosurgery

## 2011-04-23 DIAGNOSIS — M545 Low back pain: Secondary | ICD-10-CM

## 2011-04-27 ENCOUNTER — Inpatient Hospital Stay: Admission: RE | Admit: 2011-04-27 | Payer: Medicaid Other | Source: Ambulatory Visit

## 2011-05-04 ENCOUNTER — Ambulatory Visit
Admission: RE | Admit: 2011-05-04 | Discharge: 2011-05-04 | Disposition: A | Discharge status: Home / Self Care | Payer: Medicaid Other | Source: Ambulatory Visit | Attending: Neurosurgery | Admitting: Neurosurgery

## 2011-05-04 DIAGNOSIS — M545 Low back pain: Secondary | ICD-10-CM

## 2011-05-22 LAB — URINALYSIS, ROUTINE W REFLEX MICROSCOPIC
Ketones, ur: NEGATIVE
Nitrite: NEGATIVE
Protein, ur: NEGATIVE

## 2011-05-22 LAB — URINE CULTURE: Colony Count: >=100000

## 2011-05-22 LAB — URINE MICROSCOPIC-ADD ON

## 2011-06-01 ENCOUNTER — Encounter (HOSPITAL_COMMUNITY)
Admission: RE | Admit: 2011-06-01 | Discharge: 2011-06-01 | Disposition: A | Discharge status: Home / Self Care | Payer: Medicaid Other | Source: Ambulatory Visit | Attending: Neurosurgery | Admitting: Neurosurgery

## 2011-06-01 LAB — BASIC METABOLIC PANEL
Calcium: 9.2 mg/dL (ref 8.4–10.5)
Chloride: 104 mEq/L (ref 96–112)
Creatinine, Ser: 0.7 mg/dL (ref 0.50–1.10)
GFR calc Af Amer: >90 mL/min (ref >90)
GFR calc non Af Amer: >90 mL/min (ref >90)

## 2011-06-01 LAB — CBC
MCHC: 34.5 g/dL (ref 30.0–36.0)
MCV: 96.8 fL (ref 78.0–100.0)
Platelets: 201 10*3/uL (ref 150–400)
RDW: 14.8 % (ref 11.5–15.5)
WBC: 8.1 10*3/uL (ref 4.0–10.5)

## 2011-06-01 LAB — SURGICAL PCR SCREEN: MRSA, PCR: NEGATIVE

## 2011-06-05 ENCOUNTER — Inpatient Hospital Stay (HOSPITAL_COMMUNITY): Payer: Medicaid Other

## 2011-06-05 ENCOUNTER — Inpatient Hospital Stay (HOSPITAL_COMMUNITY)
Admission: RE | Admit: 2011-06-05 | Discharge: 2011-06-06 | DRG: 491 | Disposition: A | Discharge status: Home / Self Care | Payer: Medicaid Other | Source: Ambulatory Visit | Attending: Neurosurgery | Admitting: Neurosurgery

## 2011-06-05 DIAGNOSIS — Z01812 Encounter for preprocedural laboratory examination: Secondary | ICD-10-CM

## 2011-06-05 DIAGNOSIS — M5126 Other intervertebral disc displacement, lumbar region: Secondary | ICD-10-CM | POA: Diagnosis present

## 2011-06-05 DIAGNOSIS — K219 Gastro-esophageal reflux disease without esophagitis: Secondary | ICD-10-CM | POA: Diagnosis present

## 2011-06-05 DIAGNOSIS — F17200 Nicotine dependence, unspecified, uncomplicated: Secondary | ICD-10-CM | POA: Diagnosis present

## 2011-06-05 DIAGNOSIS — M47817 Spondylosis without myelopathy or radiculopathy, lumbosacral region: Principal | ICD-10-CM | POA: Diagnosis present

## 2011-06-05 DIAGNOSIS — G8929 Other chronic pain: Secondary | ICD-10-CM | POA: Diagnosis present

## 2011-06-05 DIAGNOSIS — IMO0001 Reserved for inherently not codable concepts without codable children: Secondary | ICD-10-CM | POA: Diagnosis present

## 2011-06-14 NOTE — Op Note (Signed)
[name], [name] NO.:  [id number]  MEDICAL RECORD NO.:  [id number]  LOCATION:  3534                         FACILITY:  [facility]  PHYSICIAN:  [name], M.D.        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  PROCEDURE:  Lumbar laminectomy, microdiskectomy L2-3 with microscopic dissection of left L2 nerve root, microscopic diskectomy as well as microscopic diskectomy at the left L3 nerve root.  SURGEON:  Charlene Citrin, MD  ASSISTANT:  Charlene Alert, MD  ANESTHESIA:  General endotracheal.  HISTORY OF PRESENT ILLNESS:  The patient is a 45 year old female who has had progressive worsening back and right leg pain radiating down to her hip and inside of her thigh and front of her quad consistent with L2 and L3 nerve root pattern.  MRI scan showed disk herniation at L2-3 with a superior fragment behind the two body displacing the 2 root.  Given the patient's failure to conservative treatment, MRI findings, and her clinical deterioration, the patient was recommended laminectomy and microdiskectomy.  We went over the risks and benefits of the operation with her, she understood and agreed to proceed forward as well as the expectations of outcome and alternatives.  The patient was brought to the OR and was induced under general anesthesia, placed prone.  Back was prepped and draped in usual sterile fashion.  The L3 spinous process was palpated and a small midline incision was drawn out.  Then after adequate prepping and draping and infiltration of 8 mL lidocaine with epi, a midline incision was made. Bovie cautery was used to take down subcu tissues.   Subperiosteal dissection was carried on the lamina of L2 and L3 on the right. Intraoperative x-ray identified the appropriate level, so the inferior aspect of L2, medial facet complex, superior aspect of L3 was drilled down.  Laminotomy was begun with a 2-mm Kerrison punch.  Ligament was identified and removed in a piecemeal fashion exposing the thecal sac. At this point, the operating microscope was draped and brought in the field.  Under microscopic illumination, the undersurface of the medial gutter was bitten away.  This allowed the four Penfield to dissect the dura off a disk herniation still partially contained with the ligament at L2-3.  Once the dura was dissected off, the anulus of the ligament was incised and several large fragments of disk were removed from underneath the 2 root superior to the disk space.  After using a combination of a nerve hook, coronary dilator, and hockey stick, all of the free fragments were removed and this was several in nature and all were at the level of medial border of the 2 pedicle.  After all of this was removed, the 2 root was widely decompressed.  The foramen was wide open and thecal sac was decompressed.  Looking at the disk space, it was not felt to be herniated, I  could see the annular around this was cleaned out and incised, coagulated.  I explored the 3 foramen and as I was exploring the 3 foramen I found an unexpected disk herniation that migrated inferior to the disk space and several large fragments medial to the L3 pedicle displacing the L3 root.  These were not on the MRI visualized, so this was all cleaned out.  The L3 foramen was opened up and with no more fragments appreciated, the L3 foramen was widely patent easily accepting a coronary dilator and angled nerve hook hockey stick. This was all felt to be adequately decompressed.  Meticulous hemostasis was maintained.  Gelfoam was laid on the top of the dura and muscle fascia  approximated in layers with interrupted Vicryl and the skin was closed with running 4-0 subcuticular.  Benzoin and Steri-Strips were applied.  The patient went to recovery room in stable condition.  At the end of the case, all needle count and sponge counts were correct.          ______________________________ [name], M.D.     GC/MEDQ  D:  06/05/2011  T:  06/05/2011  Job:  [id number]  Electronically Signed by [name] M.D. on 06/14/2011 06:36:10 AM

## 2011-10-20 ENCOUNTER — Encounter (HOSPITAL_COMMUNITY): Payer: Self-pay | Admitting: Emergency Medicine

## 2011-10-20 ENCOUNTER — Emergency Department (HOSPITAL_COMMUNITY)
Admission: EM | Admit: 2011-10-20 | Discharge: 2011-10-20 | Discharge status: Against Medical Advice | Payer: Medicaid Other | Attending: Emergency Medicine | Admitting: Emergency Medicine

## 2011-10-20 DIAGNOSIS — Z0389 Encounter for observation for other suspected diseases and conditions ruled out: Secondary | ICD-10-CM | POA: Insufficient documentation

## 2011-10-20 DIAGNOSIS — M549 Dorsalgia, unspecified: Secondary | ICD-10-CM | POA: Insufficient documentation

## 2011-10-20 HISTORY — DX: Dorsalgia, unspecified: M54.9

## 2011-10-20 HISTORY — DX: Other chronic pain: G89.29

## 2011-10-20 NOTE — ED Notes (Signed)
Pt c/o right sided HA with pain into side of face and into neck x 3 months

## 2012-04-21 ENCOUNTER — Other Ambulatory Visit: Payer: Self-pay | Admitting: Neurosurgery

## 2012-04-22 ENCOUNTER — Encounter (HOSPITAL_COMMUNITY): Payer: Self-pay | Admitting: Pharmacy Technician

## 2012-04-25 ENCOUNTER — Encounter (HOSPITAL_COMMUNITY): Payer: Self-pay

## 2012-04-25 ENCOUNTER — Other Ambulatory Visit (HOSPITAL_COMMUNITY): Payer: Self-pay | Admitting: Neurosurgery

## 2012-04-25 ENCOUNTER — Encounter (HOSPITAL_COMMUNITY)
Admission: RE | Admit: 2012-04-25 | Discharge: 2012-04-25 | Disposition: A | Discharge status: Home / Self Care | Payer: Medicaid Other | Source: Ambulatory Visit | Attending: Neurosurgery | Admitting: Neurosurgery

## 2012-04-25 DIAGNOSIS — R9389 Abnormal findings on diagnostic imaging of other specified body structures: Secondary | ICD-10-CM

## 2012-04-25 DIAGNOSIS — R222 Localized swelling, mass and lump, trunk: Secondary | ICD-10-CM

## 2012-04-25 HISTORY — DX: Unspecified osteoarthritis, unspecified site: M19.90

## 2012-04-25 HISTORY — DX: Depression, unspecified: F32.A

## 2012-04-25 HISTORY — DX: Major depressive disorder, single episode, unspecified: F32.9

## 2012-04-25 HISTORY — DX: Reserved for inherently not codable concepts without codable children: IMO0001

## 2012-04-25 HISTORY — DX: Other specified health status: Z78.9

## 2012-04-25 LAB — BASIC METABOLIC PANEL
BUN: 9 mg/dL (ref 6–23)
Calcium: 9.5 mg/dL (ref 8.4–10.5)
Creatinine, Ser: 0.73 mg/dL (ref 0.50–1.10)
GFR calc Af Amer: >90 mL/min (ref >90)
GFR calc non Af Amer: >90 mL/min (ref >90)
Potassium: 4.3 mEq/L (ref 3.5–5.1)

## 2012-04-25 LAB — CBC
MCH: 33.3 pg (ref 26.0–34.0)
MCHC: 34.6 g/dL (ref 30.0–36.0)
Platelets: 178 10*3/uL (ref 150–400)
RDW: 14.3 % (ref 11.5–15.5)

## 2012-04-25 LAB — SURGICAL PCR SCREEN
MRSA, PCR: NEGATIVE
Staphylococcus aureus: NEGATIVE

## 2012-04-25 LAB — TYPE AND SCREEN

## 2012-04-25 NOTE — Progress Notes (Signed)
SPOKE WITH HEATHER IN OR , SHE WILL ASK DR CRAM TO SIGN ORDERS.

## 2012-04-25 NOTE — Pre-Procedure Instructions (Signed)
20 [name]  04/25/2012   Your procedure is scheduled on: 04/27/12 Wednesday    Report to [facility] at 800 AM.  Call this number if you have problems the morning of surgery: [phone]   Remember:   Do not eat food OR DRINK :After Midnight  Take these medicines the morning of surgery with A SIP OF WATER:  CYMBALTA, HYDROCODONE   Do not wear jewelry, make-up or nail polish.  Do not wear lotions, powders, or perfumes. You may wear deodorant.  Do not shave 48 hours prior to surgery. Men may shave face and neck.  Do not bring valuables to the hospital.  Contacts, dentures or bridgework may not be worn into surgery.  Leave suitcase in the car. After surgery it may be brought to your room.  For patients admitted to the hospital, checkout time is 11:00 AM the day of discharge.   Patients discharged the day of surgery will not be allowed to drive home.  Name and phone number of your driver:   Special Instructions: CHG Shower Use Special Wash: 1/2 bottle night before surgery and 1/2 bottle morning of surgery.   Please read over the following fact sheets that you were given: Pain Booklet, Coughing and Deep Breathing, Blood Transfusion Information, MRSA Information and Surgical Site Infection Prevention

## 2012-04-26 ENCOUNTER — Encounter (HOSPITAL_COMMUNITY): Payer: Self-pay | Admitting: Vascular Surgery

## 2012-04-26 ENCOUNTER — Ambulatory Visit (HOSPITAL_COMMUNITY)
Admission: RE | Admit: 2012-04-26 | Discharge: 2012-04-26 | Disposition: A | Discharge status: Home / Self Care | Payer: Medicaid Other | Source: Ambulatory Visit | Attending: Neurosurgery | Admitting: Neurosurgery

## 2012-04-26 ENCOUNTER — Encounter (HOSPITAL_COMMUNITY): Payer: Self-pay

## 2012-04-26 DIAGNOSIS — Z87891 Personal history of nicotine dependence: Secondary | ICD-10-CM | POA: Insufficient documentation

## 2012-04-26 DIAGNOSIS — R222 Localized swelling, mass and lump, trunk: Secondary | ICD-10-CM

## 2012-04-26 DIAGNOSIS — Z01818 Encounter for other preprocedural examination: Secondary | ICD-10-CM | POA: Insufficient documentation

## 2012-04-26 DIAGNOSIS — R9389 Abnormal findings on diagnostic imaging of other specified body structures: Secondary | ICD-10-CM

## 2012-04-26 DIAGNOSIS — Z01812 Encounter for preprocedural laboratory examination: Secondary | ICD-10-CM | POA: Insufficient documentation

## 2012-04-26 MED ORDER — IOHEXOL 300 MG/ML  SOLN
100.0000 mL | Freq: Once | INTRAMUSCULAR | Status: AC | PRN
  Administered 2012-04-26: 100 mL via INTRAVENOUS

## 2012-04-26 MED ORDER — CEFAZOLIN SODIUM-DEXTROSE 2-3 GM-% IV SOLR: 2.0000 g | INTRAVENOUS | Status: DC

## 2012-04-26 NOTE — Consult Note (Signed)
Anesthesia Chart Review:  Patient is a 46 year old female posted for one level posterior lumbar fusion by Dr. Wynetta Emery on 04/27/12.  History includes smoking, depression, hysterectomy, cervical fusion, prior back surgery.  Labs noted.  CXR on 04/25/12 showed: Question of right cardiophrenic angle mass versus prominent pericardial fat pad. Further evaluation with CT of the chest with contrast is recommended.  Chest CT on 04/26/12 showed: 1. No active cardiopulmonary abnormalities.  2. Opacity identified on recent chest radiograph reflects  prominent cardiophrenic angle fat. No mass is identified.   Plan to proceed.  Shonna Chock, PA-C

## 2012-04-27 ENCOUNTER — Ambulatory Visit (HOSPITAL_COMMUNITY): Admission: RE | Admit: 2012-04-27 | Payer: Medicaid Other | Source: Ambulatory Visit | Admitting: Neurosurgery

## 2012-04-27 ENCOUNTER — Encounter (HOSPITAL_COMMUNITY): Admission: RE | Payer: Self-pay | Source: Ambulatory Visit

## 2012-04-27 SURGERY — POSTERIOR LUMBAR FUSION 1 LEVEL
Anesthesia: General | Site: Back

## 2012-04-27 NOTE — Progress Notes (Signed)
Orthopedic Tech Progress Note Patient Details:  [name] [birth year] [id number]  Patient ID: [name], female   DOB: Feb 17, 1966, 46 y.o.   MRN: [id number]   [name] 04/27/2012, 7:46 PM LS SUPPORT WITH ANT AND POST PANELS COMPLETED BY BIO TECH

## 2012-05-27 ENCOUNTER — Encounter (HOSPITAL_COMMUNITY): Payer: Self-pay | Admitting: Pharmacy Technician

## 2012-05-31 NOTE — Pre-Procedure Instructions (Signed)
[name]  05/31/2012   Your procedure is scheduled on:  06-08-2012  Report to [facility] at 10:45 AM.  Call this number if you have problems the morning of surgery: [phone]   Remember:   Do not eat food or drink:After Midnight.      Take these medicines the morning of surgery with A SIP OF WATER: cymbalta,pain medication as needed   Do not wear jewelry, make-up or nail polish.  Do not wear lotions, powders, or perfumes. You may wear deodorant.  Do not shave 48 hours prior to surgery.   Do not bring valuables to the hospital.  Contacts, dentures or bridgework may not be worn into surgery.  Leave suitcase in the car. After surgery it may be brought to your room.   For patients admitted to the hospital, checkout time is 11:00 AM the day of discharge.   Patients discharged the day of surgery will not be allowed to drive home.  Name and phone number of your driver: _________________________    Special Instructions: Shower using CHG 2 nights before surgery and the night before surgery.  If you shower the day of surgery use CHG.  Use special wash - you have one bottle of CHG for all showers.  You should use approximately 1/3 of the bottle for each shower.    Please read over the following fact sheets that you were given: Pain Booklet, Coughing and Deep Breathing, Blood Transfusion Information, MRSA Information and Surgical Site Infection Prevention

## 2012-06-01 ENCOUNTER — Encounter (HOSPITAL_COMMUNITY): Payer: Self-pay

## 2012-06-01 ENCOUNTER — Encounter (HOSPITAL_COMMUNITY)
Admission: RE | Admit: 2012-06-01 | Discharge: 2012-06-01 | Disposition: A | Discharge status: Home / Self Care | Payer: Medicaid Other | Source: Ambulatory Visit | Attending: Neurosurgery | Admitting: Neurosurgery

## 2012-06-01 ENCOUNTER — Other Ambulatory Visit: Payer: Self-pay | Admitting: Neurosurgery

## 2012-06-01 HISTORY — DX: Gastro-esophageal reflux disease without esophagitis: K21.9

## 2012-06-01 HISTORY — DX: Fibromyalgia: M79.7

## 2012-06-01 LAB — SURGICAL PCR SCREEN
MRSA, PCR: NEGATIVE
Staphylococcus aureus: NEGATIVE

## 2012-06-01 LAB — BASIC METABOLIC PANEL
BUN: 7 mg/dL (ref 6–23)
Chloride: 101 mEq/L (ref 96–112)
GFR calc Af Amer: >90 mL/min (ref >90)
GFR calc non Af Amer: >90 mL/min (ref >90)
Potassium: 4.6 mEq/L (ref 3.5–5.1)

## 2012-06-01 LAB — CBC
HCT: 43.5 % (ref 36.0–46.0)
Hemoglobin: 15.4 g/dL — ABNORMAL HIGH (ref 12.0–15.0)
MCHC: 35.4 g/dL (ref 30.0–36.0)
RDW: 14 % (ref 11.5–15.5)
WBC: 18.6 10*3/uL — ABNORMAL HIGH (ref 4.0–10.5)

## 2012-06-01 LAB — TYPE AND SCREEN
ABO/RH(D): O POS
Antibody Screen: NEGATIVE

## 2012-06-01 NOTE — Pre-Procedure Instructions (Signed)
20 [name]  06/01/2012   Your procedure is scheduled on:  06-08-2012  Report to [facility] at 10:45 AM.  Call this number if you have problems the morning of surgery: [phone]   Remember:   Do not eat food or drink:After Midnight.      Take these medicines the morning of surgery with A SIP OF WATER: cymbalta,pain medication as needed   Do not wear jewelry, make-up or nail polish.  Do not wear lotions, powders, or perfumes. You may wear deodorant.  Do not shave 48 hours prior to surgery.   Do not bring valuables to the hospital.  Contacts, dentures or bridgework may not be worn into surgery.  Leave suitcase in the car. After surgery it may be brought to your room.   For patients admitted to the hospital, checkout time is 11:00 AM the day of discharge.   Patients discharged the day of surgery will not be allowed to drive home.  Name and phone number of your driver: [name] [phone] boyfriend________________________    Special Instructions: Shower using CHG 2 nights before surgery and the night before surgery.  If you shower the day of surgery use CHG.  Use special wash - you have one bottle of CHG for all showers.  You should use approximately 1/3 of the bottle for each shower.    Please read over the following fact sheets that you were given: Pain Booklet, Coughing and Deep Breathing, Blood Transfusion Information, MRSA Information and Surgical Site Infection Prevention

## 2012-06-01 NOTE — Progress Notes (Addendum)
Called vannessa to update orders for surgery Called vannessa to fyi on wbc of 18.6

## 2012-06-02 NOTE — Consult Note (Signed)
Anesthesia Chart Review: Patient is a 46 year old female scheduled for L2-3 PLIF on 06/08/12 by Dr. Wynetta Emery.  It was rescheduled from 04/27/12.    History includes smoking, depression, hysterectomy, fibromyalgia, GERD, cervical fusion, prior back surgery. PCP is listed as Dr. Lilyan Punt.  Labs noted. Her WBC was 18.6.  Medication list does not indicate any current steroid use.  According to her PAT RN notes, Erie Noe at Dr. Lonie Peak office was already notified of patient's WBC results.  I'll order a repeat CBC for the day of surgery.  CXR on 04/25/12 showed question of right cardiophrenic angle mass versus prominent pericardial fat pad. Further evaluation with CT of the chest with contrast is recommended. She subsequently had a chest CT on 04/26/12 that showed:  1. No active cardiopulmonary abnormalities.  2. Opacity identified on recent chest radiograph reflects  prominent cardiophrenic angle fat. No mass is identified.   She had a more recent chest x-ray on 06/01/2012 that showed no active cardiopulmonary disease. The prominent epicardial fat-pad in the right costophrenic angle was unchanged.  If her follow-up WBC result is felt reasonable and patient exhibits no worrisome s/s of infection then would anticipate she could proceed from an anesthesia standpoint.  Shonna Chock, PA-C

## 2012-06-07 MED ORDER — DEXAMETHASONE SODIUM PHOSPHATE 10 MG/ML IJ SOLN
10.0000 mg | INTRAMUSCULAR | Status: DC
  Filled 2012-06-07: qty 1

## 2012-06-07 MED ORDER — CEFAZOLIN SODIUM-DEXTROSE 2-3 GM-% IV SOLR
2.0000 g | INTRAVENOUS | Status: AC
  Administered 2012-06-08: 2 g via INTRAVENOUS
  Filled 2012-06-07: qty 50

## 2012-06-08 ENCOUNTER — Encounter (HOSPITAL_COMMUNITY)
Admission: RE | Disposition: A | Discharge status: Home / Self Care | Payer: Self-pay | Source: Ambulatory Visit | Attending: Neurosurgery

## 2012-06-08 ENCOUNTER — Ambulatory Visit (HOSPITAL_COMMUNITY): Payer: Medicaid Other

## 2012-06-08 ENCOUNTER — Encounter (HOSPITAL_COMMUNITY): Payer: Self-pay | Admitting: *Deleted

## 2012-06-08 ENCOUNTER — Inpatient Hospital Stay (HOSPITAL_COMMUNITY)
Admission: RE | Admit: 2012-06-08 | Discharge: 2012-06-12 | DRG: 460 | Disposition: A | Discharge status: Home / Self Care | Payer: Medicaid Other | Source: Ambulatory Visit | Attending: Neurosurgery | Admitting: Neurosurgery

## 2012-06-08 ENCOUNTER — Encounter (HOSPITAL_COMMUNITY): Payer: Self-pay | Admitting: Vascular Surgery

## 2012-06-08 ENCOUNTER — Ambulatory Visit (HOSPITAL_COMMUNITY): Payer: Medicaid Other | Admitting: Vascular Surgery

## 2012-06-08 DIAGNOSIS — M51379 Other intervertebral disc degeneration, lumbosacral region without mention of lumbar back pain or lower extremity pain: Secondary | ICD-10-CM | POA: Diagnosis present

## 2012-06-08 DIAGNOSIS — K219 Gastro-esophageal reflux disease without esophagitis: Secondary | ICD-10-CM | POA: Diagnosis present

## 2012-06-08 DIAGNOSIS — G8929 Other chronic pain: Secondary | ICD-10-CM | POA: Diagnosis present

## 2012-06-08 DIAGNOSIS — Z79899 Other long term (current) drug therapy: Secondary | ICD-10-CM

## 2012-06-08 DIAGNOSIS — M5126 Other intervertebral disc displacement, lumbar region: Principal | ICD-10-CM | POA: Diagnosis present

## 2012-06-08 DIAGNOSIS — M129 Arthropathy, unspecified: Secondary | ICD-10-CM | POA: Diagnosis present

## 2012-06-08 DIAGNOSIS — F17200 Nicotine dependence, unspecified, uncomplicated: Secondary | ICD-10-CM | POA: Diagnosis present

## 2012-06-08 DIAGNOSIS — M5137 Other intervertebral disc degeneration, lumbosacral region: Secondary | ICD-10-CM | POA: Diagnosis present

## 2012-06-08 LAB — CBC
HCT: 43.4 % (ref 36.0–46.0)
MCHC: 35.3 g/dL (ref 30.0–36.0)
Platelets: 157 10*3/uL (ref 150–400)
RDW: 13.8 % (ref 11.5–15.5)
WBC: 10 10*3/uL (ref 4.0–10.5)

## 2012-06-08 LAB — DIFFERENTIAL
Basophils Absolute: 0 10*3/uL (ref 0.0–0.1)
Basophils Relative: 0 % (ref 0–1)
Eosinophils Absolute: 0.3 10*3/uL (ref 0.0–0.7)
Eosinophils Relative: 3 % (ref 0–5)
Monocytes Absolute: 0.7 10*3/uL (ref 0.1–1.0)

## 2012-06-08 SURGERY — POSTERIOR LUMBAR FUSION 1 LEVEL
Anesthesia: General | Site: Back | Laterality: Bilateral | Wound class: Clean

## 2012-06-08 MED ORDER — CEFAZOLIN SODIUM 1-5 GM-% IV SOLN
1.0000 g | Freq: Three times a day (TID) | INTRAVENOUS | Status: AC
  Administered 2012-06-08 – 2012-06-09 (×2): 1 g via INTRAVENOUS
  Filled 2012-06-08 (×2): qty 50

## 2012-06-08 MED ORDER — ALUM & MAG HYDROXIDE-SIMETH 200-200-20 MG/5ML PO SUSP
30.0000 mL | Freq: Four times a day (QID) | ORAL | Status: DC | PRN
  Filled 2012-06-08 (×2): qty 30

## 2012-06-08 MED ORDER — FENTANYL CITRATE 0.05 MG/ML IJ SOLN
INTRAMUSCULAR | Status: DC | PRN
  Administered 2012-06-08 (×2): 50 ug via INTRAVENOUS
  Administered 2012-06-08: 100 ug via INTRAVENOUS
  Administered 2012-06-08: 50 ug via INTRAVENOUS

## 2012-06-08 MED ORDER — PROMETHAZINE HCL 25 MG/ML IJ SOLN: 6.2500 mg | INTRAMUSCULAR | Status: DC | PRN

## 2012-06-08 MED ORDER — MEPERIDINE HCL 25 MG/ML IJ SOLN: 6.2500 mg | INTRAMUSCULAR | Status: DC | PRN

## 2012-06-08 MED ORDER — ONDANSETRON HCL 4 MG/2ML IJ SOLN
INTRAMUSCULAR | Status: DC | PRN
  Administered 2012-06-08: 4 mg via INTRAVENOUS

## 2012-06-08 MED ORDER — ONDANSETRON HCL 4 MG/2ML IJ SOLN: 4.0000 mg | INTRAMUSCULAR | Status: DC | PRN

## 2012-06-08 MED ORDER — OXYCODONE-ACETAMINOPHEN 5-325 MG PO TABS
1.0000 | ORAL_TABLET | ORAL | Status: DC | PRN
  Administered 2012-06-08: 1 via ORAL
  Administered 2012-06-08: 2 via ORAL
  Administered 2012-06-09: 1 via ORAL
  Filled 2012-06-08: qty 1
  Filled 2012-06-08: qty 2
  Filled 2012-06-08 (×2): qty 1

## 2012-06-08 MED ORDER — HYDROMORPHONE HCL PF 1 MG/ML IJ SOLN
INTRAMUSCULAR | Status: AC
  Administered 2012-06-08: 0.5 mg
  Filled 2012-06-08: qty 1

## 2012-06-08 MED ORDER — SODIUM CHLORIDE 0.9 % IV SOLN
INTRAVENOUS | Status: AC
  Filled 2012-06-08: qty 500

## 2012-06-08 MED ORDER — SODIUM CHLORIDE 0.9 % IR SOLN
Status: DC | PRN
  Administered 2012-06-08: 14:00:00

## 2012-06-08 MED ORDER — HYDROMORPHONE HCL PF 1 MG/ML IJ SOLN: 0.5000 mg | INTRAMUSCULAR | Status: DC | PRN

## 2012-06-08 MED ORDER — OXYCODONE HCL 5 MG PO TABS
ORAL_TABLET | ORAL | Status: AC
  Filled 2012-06-08: qty 1

## 2012-06-08 MED ORDER — MIDAZOLAM HCL 5 MG/5ML IJ SOLN
INTRAMUSCULAR | Status: DC | PRN
  Administered 2012-06-08: 2 mg via INTRAVENOUS

## 2012-06-08 MED ORDER — 0.9 % SODIUM CHLORIDE (POUR BTL) OPTIME
TOPICAL | Status: DC | PRN
  Administered 2012-06-08: 1000 mL

## 2012-06-08 MED ORDER — SODIUM CHLORIDE 0.9 % IJ SOLN: 9.0000 mL | INTRAMUSCULAR | Status: DC | PRN

## 2012-06-08 MED ORDER — DIPHENHYDRAMINE HCL 50 MG/ML IJ SOLN: 12.5000 mg | Freq: Four times a day (QID) | INTRAMUSCULAR | Status: DC | PRN

## 2012-06-08 MED ORDER — HYDROMORPHONE HCL PF 1 MG/ML IJ SOLN
INTRAMUSCULAR | Status: DC | PRN
  Administered 2012-06-08: 1 mg via INTRAVENOUS

## 2012-06-08 MED ORDER — LIDOCAINE-EPINEPHRINE 1 %-1:100000 IJ SOLN
INTRAMUSCULAR | Status: DC | PRN
  Administered 2012-06-08: 9 mL

## 2012-06-08 MED ORDER — HYDROMORPHONE HCL PF 1 MG/ML IJ SOLN
1.0000 mg | Freq: Once | INTRAMUSCULAR | Status: AC
  Administered 2012-06-08: 0.5 mg via INTRAVENOUS

## 2012-06-08 MED ORDER — HYDROMORPHONE 0.3 MG/ML IV SOLN
INTRAVENOUS | Status: DC
  Administered 2012-06-09: 10:00:00 via INTRAVENOUS
  Administered 2012-06-09: 2.97 mg via INTRAVENOUS
  Administered 2012-06-09: 5 mg via INTRAVENOUS
  Filled 2012-06-08 (×2): qty 25

## 2012-06-08 MED ORDER — CYCLOBENZAPRINE HCL 10 MG PO TABS
10.0000 mg | ORAL_TABLET | Freq: Three times a day (TID) | ORAL | Status: DC | PRN
  Administered 2012-06-08 – 2012-06-11 (×7): 10 mg via ORAL
  Filled 2012-06-08 (×8): qty 1

## 2012-06-08 MED ORDER — SODIUM CHLORIDE 0.9 % IJ SOLN
3.0000 mL | Freq: Two times a day (BID) | INTRAMUSCULAR | Status: DC
  Administered 2012-06-09 – 2012-06-11 (×4): 3 mL via INTRAVENOUS

## 2012-06-08 MED ORDER — HYDROMORPHONE HCL PF 1 MG/ML IJ SOLN
INTRAMUSCULAR | Status: AC
  Filled 2012-06-08: qty 1

## 2012-06-08 MED ORDER — SODIUM CHLORIDE 0.9 % IJ SOLN: 3.0000 mL | INTRAMUSCULAR | Status: DC | PRN

## 2012-06-08 MED ORDER — THROMBIN 20000 UNITS EX SOLR
CUTANEOUS | Status: DC | PRN
  Administered 2012-06-08: 14:00:00 via TOPICAL

## 2012-06-08 MED ORDER — INFLUENZA VIRUS VACC SPLIT PF IM SUSP
0.5000 mL | INTRAMUSCULAR | Status: AC
  Administered 2012-06-09: 0.5 mL via INTRAMUSCULAR
  Filled 2012-06-08: qty 0.5

## 2012-06-08 MED ORDER — NALOXONE HCL 0.4 MG/ML IJ SOLN: 0.4000 mg | INTRAMUSCULAR | Status: DC | PRN

## 2012-06-08 MED ORDER — ACETAMINOPHEN 650 MG RE SUPP: 650.0000 mg | RECTAL | Status: DC | PRN

## 2012-06-08 MED ORDER — LACTATED RINGERS IV SOLN
INTRAVENOUS | Status: DC | PRN
  Administered 2012-06-08 (×3): via INTRAVENOUS

## 2012-06-08 MED ORDER — DIPHENHYDRAMINE HCL 12.5 MG/5ML PO ELIX: 12.5000 mg | ORAL_SOLUTION | Freq: Four times a day (QID) | ORAL | Status: DC | PRN

## 2012-06-08 MED ORDER — BACITRACIN 50000 UNITS IM SOLR
INTRAMUSCULAR | Status: AC
  Filled 2012-06-08: qty 1

## 2012-06-08 MED ORDER — CARISOPRODOL 350 MG PO TABS
350.0000 mg | ORAL_TABLET | Freq: Every day | ORAL | Status: DC
  Administered 2012-06-09 – 2012-06-10 (×2): 350 mg via ORAL
  Filled 2012-06-08 (×2): qty 1

## 2012-06-08 MED ORDER — GLYCOPYRROLATE 0.2 MG/ML IJ SOLN
INTRAMUSCULAR | Status: DC | PRN
  Administered 2012-06-08: .8 mg via INTRAVENOUS

## 2012-06-08 MED ORDER — OXYCODONE HCL 5 MG/5ML PO SOLN: 5.0000 mg | Freq: Once | ORAL | Status: AC | PRN

## 2012-06-08 MED ORDER — ACETAMINOPHEN 325 MG PO TABS: 650.0000 mg | ORAL_TABLET | ORAL | Status: DC | PRN

## 2012-06-08 MED ORDER — ACETAMINOPHEN 10 MG/ML IV SOLN
1000.0000 mg | Freq: Four times a day (QID) | INTRAVENOUS | Status: AC
  Administered 2012-06-08 – 2012-06-09 (×2): 1000 mg via INTRAVENOUS
  Filled 2012-06-08 (×4): qty 100

## 2012-06-08 MED ORDER — CYCLOBENZAPRINE HCL 10 MG PO TABS
ORAL_TABLET | ORAL | Status: AC
  Filled 2012-06-08: qty 1

## 2012-06-08 MED ORDER — EPHEDRINE SULFATE 50 MG/ML IJ SOLN
INTRAMUSCULAR | Status: DC | PRN
  Administered 2012-06-08: 10 mg via INTRAVENOUS

## 2012-06-08 MED ORDER — ACETAMINOPHEN 10 MG/ML IV SOLN
1000.0000 mg | Freq: Four times a day (QID) | INTRAVENOUS | Status: DC
  Filled 2012-06-08 (×4): qty 100

## 2012-06-08 MED ORDER — PROPOFOL 10 MG/ML IV BOLUS
INTRAVENOUS | Status: DC | PRN
Start: 2012-06-08 — End: 2012-06-08
  Administered 2012-06-08: 200 mg via INTRAVENOUS

## 2012-06-08 MED ORDER — HYDROMORPHONE 0.3 MG/ML IV SOLN
INTRAVENOUS | Status: AC
  Administered 2012-06-08: 19:00:00
  Filled 2012-06-08: qty 25

## 2012-06-08 MED ORDER — LIDOCAINE HCL (CARDIAC) 20 MG/ML IV SOLN
INTRAVENOUS | Status: DC | PRN
  Administered 2012-06-08: 80 mg via INTRAVENOUS

## 2012-06-08 MED ORDER — PHENYLEPHRINE HCL 10 MG/ML IJ SOLN
INTRAMUSCULAR | Status: DC | PRN
Start: 2012-06-08 — End: 2012-06-08
  Administered 2012-06-08 (×7): 80 ug via INTRAVENOUS

## 2012-06-08 MED ORDER — HYDROCODONE-ACETAMINOPHEN 5-325 MG PO TABS: 1.0000 | ORAL_TABLET | ORAL | Status: DC | PRN

## 2012-06-08 MED ORDER — NICOTINE 14 MG/24HR TD PT24
14.0000 mg | MEDICATED_PATCH | Freq: Every day | TRANSDERMAL | Status: DC
  Administered 2012-06-08 – 2012-06-11 (×4): 14 mg via TRANSDERMAL
  Filled 2012-06-08 (×5): qty 1

## 2012-06-08 MED ORDER — NEOSTIGMINE METHYLSULFATE 1 MG/ML IJ SOLN
INTRAMUSCULAR | Status: DC | PRN
  Administered 2012-06-08: 5 mg via INTRAVENOUS

## 2012-06-08 MED ORDER — DEXAMETHASONE SODIUM PHOSPHATE 4 MG/ML IJ SOLN
INTRAMUSCULAR | Status: DC | PRN
Start: 2012-06-08 — End: 2012-06-08
  Administered 2012-06-08: 8 mg via INTRAVENOUS

## 2012-06-08 MED ORDER — HYDROMORPHONE HCL PF 1 MG/ML IJ SOLN
0.2500 mg | INTRAMUSCULAR | Status: DC | PRN
  Administered 2012-06-08 (×4): 0.5 mg via INTRAVENOUS

## 2012-06-08 MED ORDER — BUPIVACAINE HCL (PF) 0.25 % IJ SOLN
INTRAMUSCULAR | Status: DC | PRN
  Administered 2012-06-08: 10 mL

## 2012-06-08 MED ORDER — OXYCODONE HCL 5 MG PO TABS
5.0000 mg | ORAL_TABLET | Freq: Once | ORAL | Status: AC | PRN
  Administered 2012-06-08: 5 mg via ORAL

## 2012-06-08 MED ORDER — MENTHOL 3 MG MT LOZG: 1.0000 | LOZENGE | OROMUCOSAL | Status: DC | PRN

## 2012-06-08 MED ORDER — SODIUM CHLORIDE 0.9 % IV SOLN: 250.0000 mL | INTRAVENOUS | Status: DC

## 2012-06-08 MED ORDER — PHENOL 1.4 % MT LIQD: 1.0000 | OROMUCOSAL | Status: DC | PRN

## 2012-06-08 MED ORDER — ACETAMINOPHEN 10 MG/ML IV SOLN
INTRAVENOUS | Status: AC
  Administered 2012-06-08: 1000 mg via INTRAVENOUS
  Filled 2012-06-08: qty 100

## 2012-06-08 MED ORDER — ROCURONIUM BROMIDE 100 MG/10ML IV SOLN
INTRAVENOUS | Status: DC | PRN
  Administered 2012-06-08: 10 mg via INTRAVENOUS
  Administered 2012-06-08: 50 mg via INTRAVENOUS
  Administered 2012-06-08: 10 mg via INTRAVENOUS

## 2012-06-08 MED ORDER — DULOXETINE HCL 30 MG PO CPEP
30.0000 mg | ORAL_CAPSULE | Freq: Every day | ORAL | Status: DC
  Administered 2012-06-09 – 2012-06-12 (×4): 30 mg via ORAL
  Filled 2012-06-08 (×4): qty 1

## 2012-06-08 MED ORDER — ARTIFICIAL TEARS OP OINT
TOPICAL_OINTMENT | OPHTHALMIC | Status: DC | PRN
  Administered 2012-06-08: 1 via OPHTHALMIC

## 2012-06-08 MED ORDER — PNEUMOCOCCAL VAC POLYVALENT 25 MCG/0.5ML IJ INJ
0.5000 mL | INJECTION | INTRAMUSCULAR | Status: AC
  Administered 2012-06-09: 0.5 mL via INTRAMUSCULAR
  Filled 2012-06-08 (×2): qty 0.5

## 2012-06-08 MED ORDER — ONDANSETRON HCL 4 MG/2ML IJ SOLN: 4.0000 mg | Freq: Four times a day (QID) | INTRAMUSCULAR | Status: DC | PRN

## 2012-06-08 SURGICAL SUPPLY — 75 items
ADH SKN CLS APL DERMABOND .7 (GAUZE/BANDAGES/DRESSINGS) ×1
ADH SKN CLS LQ APL DERMABOND (GAUZE/BANDAGES/DRESSINGS) ×1
APL SKNCLS STERI-STRIP NONHPOA (GAUZE/BANDAGES/DRESSINGS) ×1
BAG DECANTER FOR FLEXI CONT (MISCELLANEOUS) ×2 IMPLANT
BENZOIN TINCTURE PRP APPL 2/3 (GAUZE/BANDAGES/DRESSINGS) ×2 IMPLANT
BLADE SURG 11 STRL SS (BLADE) ×2 IMPLANT
BLADE SURG ROTATE 9660 (MISCELLANEOUS) IMPLANT
BRUSH SCRUB EZ PLAIN DRY (MISCELLANEOUS) ×2 IMPLANT
BUR MATCHSTICK NEURO 3.0 LAGG (BURR) ×2 IMPLANT
BUR PRECISION FLUTE 6.0 (BURR) ×2 IMPLANT
CANISTER SUCTION 2500CC (MISCELLANEOUS) ×2 IMPLANT
CAP LOCKING REVERE (Cap) ×4 IMPLANT
CLOTH BEACON ORANGE TIMEOUT ST (SAFETY) ×2 IMPLANT
CONNECTOR VAR CROSS 5.5X38-50 (Connector) ×1 IMPLANT
CONT SPEC 4OZ CLIKSEAL STRL BL (MISCELLANEOUS) ×4 IMPLANT
COVER BACK TABLE 24X17X13 BIG (DRAPES) IMPLANT
COVER TABLE BACK 60X90 (DRAPES) ×2 IMPLANT
DECANTER SPIKE VIAL GLASS SM (MISCELLANEOUS) ×2 IMPLANT
DERMABOND ADHESIVE PROPEN (GAUZE/BANDAGES/DRESSINGS) ×1
DERMABOND ADVANCED (GAUZE/BANDAGES/DRESSINGS) ×1
DERMABOND ADVANCED .7 DNX12 (GAUZE/BANDAGES/DRESSINGS) ×1 IMPLANT
DERMABOND ADVANCED .7 DNX6 (GAUZE/BANDAGES/DRESSINGS) IMPLANT
DRAPE C-ARM 42X72 X-RAY (DRAPES) ×4 IMPLANT
DRAPE LAPAROTOMY 100X72X124 (DRAPES) ×2 IMPLANT
DRAPE POUCH INSTRU U-SHP 10X18 (DRAPES) ×2 IMPLANT
DRAPE PROXIMA HALF (DRAPES) IMPLANT
DRAPE SURG 17X23 STRL (DRAPES) ×2 IMPLANT
DRSG OPSITE 4X5.5 SM (GAUZE/BANDAGES/DRESSINGS) ×3 IMPLANT
ELECT REM PT RETURN 9FT ADLT (ELECTROSURGICAL) ×2
ELECTRODE REM PT RTRN 9FT ADLT (ELECTROSURGICAL) ×1 IMPLANT
EVACUATOR 3/16  PVC DRAIN (DRAIN) ×1
EVACUATOR 3/16 PVC DRAIN (DRAIN) ×1 IMPLANT
GAUZE SPONGE 4X4 16PLY XRAY LF (GAUZE/BANDAGES/DRESSINGS) IMPLANT
GLOVE BIO SURGEON STRL SZ 6.5 (GLOVE) ×2 IMPLANT
GLOVE BIO SURGEON STRL SZ8 (GLOVE) ×4 IMPLANT
GLOVE BIOGEL PI IND STRL 7.0 (GLOVE) IMPLANT
GLOVE BIOGEL PI IND STRL 7.5 (GLOVE) IMPLANT
GLOVE BIOGEL PI IND STRL 8 (GLOVE) IMPLANT
GLOVE BIOGEL PI INDICATOR 7.0 (GLOVE) ×2
GLOVE BIOGEL PI INDICATOR 7.5 (GLOVE) ×1
GLOVE BIOGEL PI INDICATOR 8 (GLOVE) ×1
GLOVE ECLIPSE 7.5 STRL STRAW (GLOVE) ×2 IMPLANT
GLOVE EXAM NITRILE LRG STRL (GLOVE) IMPLANT
GLOVE EXAM NITRILE MD LF STRL (GLOVE) ×2 IMPLANT
GLOVE EXAM NITRILE XL STR (GLOVE) IMPLANT
GLOVE EXAM NITRILE XS STR PU (GLOVE) IMPLANT
GLOVE INDICATOR 8.5 STRL (GLOVE) ×4 IMPLANT
GOWN BRE IMP SLV AUR LG STRL (GOWN DISPOSABLE) ×3 IMPLANT
GOWN BRE IMP SLV AUR XL STRL (GOWN DISPOSABLE) ×5 IMPLANT
GOWN STRL REIN 2XL LVL4 (GOWN DISPOSABLE) IMPLANT
KIT BASIN OR (CUSTOM PROCEDURE TRAY) ×2 IMPLANT
KIT ROOM TURNOVER OR (KITS) ×2 IMPLANT
MILL MEDIUM DISP (BLADE) ×1 IMPLANT
NDL HYPO 25X1 1.5 SAFETY (NEEDLE) ×1 IMPLANT
NEEDLE HYPO 25X1 1.5 SAFETY (NEEDLE) ×2 IMPLANT
NS IRRIG 1000ML POUR BTL (IV SOLUTION) ×2 IMPLANT
PACK LAMINECTOMY NEURO (CUSTOM PROCEDURE TRAY) ×2 IMPLANT
PAD ARMBOARD 7.5X6 YLW CONV (MISCELLANEOUS) ×6 IMPLANT
PUTTY BONE DBX 5CC MIX (Putty) ×1 IMPLANT
ROD CURVED 5.5MMX55MM (Rod) ×2 IMPLANT
SCREW PEDICLE 6.5X40MM (Screw) ×4 IMPLANT
SPACER CALIBER 10X22MM 8-12MM (Spacer) ×1 IMPLANT
SPONGE GAUZE 4X4 12PLY (GAUZE/BANDAGES/DRESSINGS) ×2 IMPLANT
SPONGE LAP 4X18 X RAY DECT (DISPOSABLE) IMPLANT
SPONGE SURGIFOAM ABS GEL 100 (HEMOSTASIS) ×2 IMPLANT
STRIP CLOSURE SKIN 1/2X4 (GAUZE/BANDAGES/DRESSINGS) ×3 IMPLANT
SUT VIC AB 0 CT1 18XCR BRD8 (SUTURE) ×2 IMPLANT
SUT VIC AB 0 CT1 8-18 (SUTURE) ×2
SUT VIC AB 2-0 CT1 18 (SUTURE) ×2 IMPLANT
SUT VICRYL 4-0 PS2 18IN ABS (SUTURE) ×2 IMPLANT
SYR 20ML ECCENTRIC (SYRINGE) ×2 IMPLANT
TOWEL OR 17X24 6PK STRL BLUE (TOWEL DISPOSABLE) ×2 IMPLANT
TOWEL OR 17X26 10 PK STRL BLUE (TOWEL DISPOSABLE) ×2 IMPLANT
TRAY FOLEY CATH 14FRSI W/METER (CATHETERS) ×2 IMPLANT
WATER STERILE IRR 1000ML POUR (IV SOLUTION) ×2 IMPLANT

## 2012-06-08 NOTE — Preoperative (Signed)
Beta Blockers   Reason not to administer Beta Blockers:Not Applicable 

## 2012-06-08 NOTE — Transfer of Care (Signed)
Immediate Anesthesia Transfer of Care Note  Patient: [name]  Procedure(s) Performed: Procedure(s) (LRB) with comments: POSTERIOR LUMBAR FUSION 1 LEVEL (Bilateral) - Lumbar two/three posterior lumbar interbody fusion with pedicle screws  Patient Location: PACU  Anesthesia Type:General  Level of Consciousness: awake, alert  and oriented  Airway & Oxygen Therapy: Patient Spontanous Breathing and Patient connected to nasal cannula oxygen  Post-op Assessment: Report given to PACU RN and Post -op Vital signs reviewed and stable  Post vital signs: Reviewed and stable  Complications: No apparent anesthesia complications

## 2012-06-08 NOTE — Anesthesia Postprocedure Evaluation (Signed)
  Anesthesia Post-op Note  Patient: [name]  Procedure(s) Performed: Procedure(s) (LRB) with comments: POSTERIOR LUMBAR FUSION 1 LEVEL (Bilateral) - Lumbar two/three posterior lumbar interbody fusion with pedicle screws  Patient Location: PACU  Anesthesia Type:General  Level of Consciousness: awake  Airway and Oxygen Therapy: Patient Spontanous Breathing  Post-op Pain: mild  Post-op Assessment: Post-op Vital signs reviewed  Post-op Vital Signs: stable  Complications: No apparent anesthesia complications

## 2012-06-08 NOTE — Progress Notes (Signed)
Orthopedic Tech Progress Note Patient Details:  [name] September 16, 1965 [id number]  Patient ID: [name] August, female   DOB: [birth year], 46 y.o.   MRN: [id number]   [name] 06/08/2012, 9:43 AM L-S SUPPORT WITH ANT. AND POST. PANELS COMPLETED BY [employer]

## 2012-06-08 NOTE — Op Note (Signed)
Preoperative diagnosis: Lumbar spinal stenosis recurrent ruptured disc and degenerative disc disease L2-3  Postoperative diagnosis: Same  Procedure: Redo Decompressive lumbar laminectomy and excess will be needed with a standard interbody fusion  #2 posterior lumbar interbody fusion L2-3 using a caliber expandable peek cages packed with local autograft mixed with DBX  And 3 pedicle screw fixation using the globus Revere pedicle screw system at L2-3  #4 posterior lateral arthrodesis L2-3 lesion local autograft mixed with DBX  #5 placement of a medium neck drain  Surgeon: Jillyn Hidden Brice Kossman  Assistant: Shirlean Kelly  Anesthesia: Gen.  EBL: Minimal  History of present illness: This 46 year old female has had long-standing to be with her back she's had previous L4-5 fusion she developed a disc herniation L2-3 underwent laminectomy discectomy initially did very well however over the last weeks and months of progressive worsening back and bilateral leg pain worse on the on the right with pain rating down L3 distribution patient failed all forms of conservative treatment followup imaging showed recurrent disc herniation with severe stenosis this progression of collapse and degeneration of the disc space with motor changes and kyphosis consistent with progressive instability at L2-3. Due to stricture treatment progression of clinical syndrome and imaging findings patient recommended decompression stabilization procedure at L2-3 the risks and benefits of the operation as well as perioperative course expectations of outcome alternatives of surgery were all spine the patient she understood and agreed to proceed forward.  Operative procedure: Patient was brought into the or was induced under general anesthesia positioned prone the Wilson frame her back was prepped and draped in routine sterile fashion. Her old incision was opened up and extended cephalad subperiosteal dissections care lamina of L2-3 exposing  the TPS at L2-3. Interoperative X. identify the appropriate level so spinous process of 2 was removed central decompression was begun complete medial facetectomies were performed on the left side and then data dissected through the scar tissue a complete medial facetectomy was performed on the right excess amount scar and epidural fibrosis was teased off of the L2 and L3 nerve root on that side gain lateral access to the disc space synovitis superior tickling facet. Again decompression of the L2 and L3 nerve roots skeletonized decompressive second pedicle screw placement using a high-speed drill fluoroscopy and both internal and external landmarks a pilot was drilled L2 on the right cannulated with the awl probed with a A 55 tap and a 6 x 40 screw inserted at L2 and L3 on the right was 6 x 40 at L2-L3 on the left all screws inserted in similar fashion. After all 4 Screws in Pl. stents taken the interbody work first working on the left side disc spaces cleanout a size 7 distractor was inserted and work into the scar tissue a large recurrent disc was removed to superior the disc space underneath the axilla of the L2 nerve root this was teased away with I nerve hook and Epstein curet the spaces cleanout endplates were prepared an 8 mm stable cage was inserted after being packed with local autograft mixed with DBX and expanded up approximately 2 additional millimeters. Then I distractor was removed posterior fluoroscopy used the step along the way to confirm placement then the left-sided interspace was was prepared and cleanout local are graft was packed centrally left side this with correction left side expandable cage was inserted and expanded up again 2 additional millimeters then the wound scope was irrigated meticulous in space was maintained aggressive decortication was care MTPs  or lateral gutters medial local are graft pack posterior laterally 55 mm rods were placed top tightness tightened down at L3 the L2  screws compressed against L3 across it was applied a drain was placed and after expiration of all the foramina confirm patency no migration of graft material Gelfoam was laid up the dura. At this point all the fluoroscopy posterior fossa confirmed good position of  to screws and implants and then the wounds closed in layers with after Vicryl and the skin was closed running 4 septic or benzoin station foci patient recovered in stable condition at the end of case all needle counts and sponge counts were correct.

## 2012-06-08 NOTE — H&P (Signed)
[name] is an 46 y.o. female.   Chief Complaint: back and leg pain HPI: patient is a 46 year old female whose had a long-standing history with her back should undergone previous laminectomy and fusion at L4-5 patient subsequently developed a to 3 disc herniation underwent a laminotomy discectomy and initially did very well however presented with progressive worsening back pain and leg pain imaging revealed a recurrent herniation patient's failed all forms of conservative treatment and and due to this and a progression of clinical syndrome and imaging findings she has been recommended a decompression redo and stabilization procedure at that level I have reviewed the risks and benefits of the operation as well as perioperative course and expectations of outcome and alternatives surgery and she understands and agrees to proceed forward.  Past Medical History  Diagnosis Date  . Chronic back pain   . Depression   . Cold   . Arthritis   . No pertinent past medical history   . GERD (gastroesophageal reflux disease)     occ  . Fibromyalgia     dx yrs ago no tx    Past Surgical History  Procedure Date  . Abdominal hysterectomy   . Cholecystectomy     2004  . Hemorroidectomy     2004  . Cervical fusion     2006  . Carpal tunnel release     BILAT 2007   . Ulnar tunnel release     LEFT ARM 2010   . Back surgery     2008 FUSION, 2010 [facility] REMOVAL, 2012     History reviewed. No pertinent family history. Social History:  reports that she has been smoking Cigarettes.  She has a 20 pack-year smoking history. She does not have any smokeless tobacco history on file. She reports that she drinks alcohol. She reports that she does not use illicit drugs.  Allergies:  Allergies  Allergen Reactions  . Hydrocodone Other (See Comments)    hyper  . Codeine     Hyper, anxious, can't sleep  . Ibuprofen Other (See Comments)    GI     Medications Prior to Admission  Medication Sig Dispense  Refill  . sodium chloride (OCEAN) 0.65 % nasal spray Place 2 sprays into the nose daily as needed. For nasal congestion      . carisoprodol (SOMA) 350 MG tablet Take 350 mg by mouth 1 day or 1 dose.      . DULoxetine (CYMBALTA) 30 MG capsule Take 30 mg by mouth daily.      Marland Kitchen HYDROcodone-acetaminophen (NORCO/VICODIN) 5-325 MG per tablet Take 1-2 tablets by mouth every 6 (six) hours as needed. For pain        Results for orders placed during the hospital encounter of 06/08/12 (from the past 48 hour(s))  DIFFERENTIAL     Status: Normal   Collection Time   06/08/12 10:16 AM      Component Value Range Comment   Neutrophils Relative 66  43 - 77 %    Neutro Abs 6.6  1.7 - 7.7 K/uL    Lymphocytes Relative 24  12 - 46 %    Lymphs Abs 2.4  0.7 - 4.0 K/uL    Monocytes Relative 7  3 - 12 %    Monocytes Absolute 0.7  0.1 - 1.0 K/uL    Eosinophils Relative 3  0 - 5 %    Eosinophils Absolute 0.3  0.0 - 0.7 K/uL    Basophils Relative 0  0 -  1 %    Basophils Absolute 0.0  0.0 - 0.1 K/uL   CBC     Status: Abnormal   Collection Time   06/08/12 10:16 AM      Component Value Range Comment   WBC 10.0  4.0 - 10.5 K/uL    RBC 4.49  3.87 - 5.11 MIL/uL    Hemoglobin 15.3 (*) 12.0 - 15.0 g/dL    HCT 16.1  09.6 - 04.5 %    MCV 96.7  78.0 - 100.0 fL    MCH 34.1 (*) 26.0 - 34.0 pg    MCHC 35.3  30.0 - 36.0 g/dL    RDW 40.9  81.1 - 91.4 %    Platelets 157  150 - 400 K/uL    No results found.  Review of Systems  Constitutional: Negative.   HENT: Negative.   Eyes: Negative.   Respiratory: Negative.   Cardiovascular: Negative.   Gastrointestinal: Negative.   Genitourinary: Negative.   Musculoskeletal: Positive for back pain and joint pain.  Skin: Negative.   Neurological: Positive for tingling and sensory change.  Endo/Heme/Allergies: Negative.     Blood pressure 153/97, pulse 93, temperature 98.3 F (36.8 C), temperature source Oral, resp. rate 18, SpO2 98.00%. Physical Exam  Constitutional:  She is oriented to person, place, and time. She appears well-developed.  HENT:  Head: Normocephalic.  Eyes: Pupils are equal, round, and reactive to light.  Neck: Normal range of motion.  Cardiovascular: Normal rate.   Respiratory: Effort normal.  GI: Soft.  Neurological: She is alert and oriented to person, place, and time. She has normal strength. GCS eye subscore is 4. GCS verbal subscore is 5. GCS motor subscore is 6.  Reflex Scores:      Patellar reflexes are 0 on the right side and 0 on the left side.      Achilles reflexes are 0 on the right side and 0 on the left side.      Strength is 5 out of 5 in her iliopsoas, quads, and she's, gastrocs, anterior tibialis, and EHL. She does have some pain limited weakness in the right quad from her 2- 3 disc herniation.     Assessment/Plan 46 year old female presents for an L2-3 posterior lumbar interbody fusion  Keean Wilmeth P 06/08/2012, 12:40 PM

## 2012-06-08 NOTE — Anesthesia Preprocedure Evaluation (Addendum)
Anesthesia Evaluation  Patient identified by MRN, date of birth, ID band Patient awake    Reviewed: Allergy & Precautions, H&P , NPO status , Patient's Chart, lab work & pertinent test results, reviewed documented beta blocker date and time   History of Anesthesia Complications Negative for: history of anesthetic complications  Airway Mallampati: I TM Distance: >3 FB Neck ROM: Full    Dental  (+) Teeth Intact and Dental Advisory Given   Pulmonary Current Smoker,  breath sounds clear to auscultation        Cardiovascular negative cardio ROS  Rhythm:Regular Rate:Normal     Neuro/Psych PSYCHIATRIC DISORDERS Depression  Neuromuscular disease    GI/Hepatic Neg liver ROS, GERD-  Controlled,  Endo/Other  negative endocrine ROS  Renal/GU negative Renal ROS     Musculoskeletal  (+) Fibromyalgia -  Abdominal   Peds  Hematology negative hematology ROS (+)   Anesthesia Other Findings   Reproductive/Obstetrics negative OB ROS                         Anesthesia Physical Anesthesia Plan  ASA: II  Anesthesia Plan: General   Post-op Pain Management:    Induction: Intravenous  Airway Management Planned: Oral ETT  Additional Equipment:   Intra-op Plan:   Post-operative Plan: Extubation in OR  Informed Consent: I have reviewed the patients History and Physical, chart, labs and discussed the procedure including the risks, benefits and alternatives for the proposed anesthesia with the patient or authorized representative who has indicated his/her understanding and acceptance.     Plan Discussed with: CRNA and Surgeon  Anesthesia Plan Comments:         Anesthesia Quick Evaluation

## 2012-06-09 ENCOUNTER — Encounter (HOSPITAL_COMMUNITY): Payer: Self-pay | Admitting: *Deleted

## 2012-06-09 MED ORDER — HYDROMORPHONE HCL 2 MG PO TABS
2.0000 mg | ORAL_TABLET | ORAL | Status: DC | PRN
  Administered 2012-06-09 – 2012-06-11 (×8): 4 mg via ORAL
  Filled 2012-06-09 (×8): qty 2

## 2012-06-09 MED ORDER — OXYCODONE HCL 5 MG PO TABS
5.0000 mg | ORAL_TABLET | ORAL | Status: DC | PRN
  Administered 2012-06-09: 10 mg via ORAL
  Administered 2012-06-09 – 2012-06-10 (×5): 15 mg via ORAL
  Filled 2012-06-09 (×4): qty 3
  Filled 2012-06-09: qty 2
  Filled 2012-06-09: qty 3

## 2012-06-09 MED ORDER — OXYCODONE HCL 5 MG PO TABS
5.0000 mg | ORAL_TABLET | Freq: Once | ORAL | Status: AC | PRN
  Administered 2012-06-09: 5 mg via ORAL
  Filled 2012-06-09: qty 1

## 2012-06-09 MED ORDER — OXYCODONE HCL 5 MG/5ML PO SOLN: 5.0000 mg | Freq: Once | ORAL | Status: AC | PRN

## 2012-06-09 NOTE — Evaluation (Addendum)
Physical Therapy Evaluation Patient Details Name: [name] MRN: [id number] DOB: [birth year] Today's Date: 06/09/2012 Time: 0925-0950 PT Time Calculation (min): 25 min  PT Assessment / Plan / Recommendation Clinical Impression  Pt admitted s/p L2-3 fusion along with the below PT problem list. Pt would benefit from acute PT to maximize independence and facilitate d/c home with HHPT.    PT Assessment  Patient needs continued PT services    Follow Up Recommendations  Home health PT    Does the patient have the potential to tolerate intense rehabilitation      Barriers to Discharge None      Equipment Recommendations  Rolling walker with 5" wheels    Recommendations for Other Services     Frequency Min 5X/week    Precautions / Restrictions Precautions Precautions: Back Precaution Booklet Issued: No Precaution Comments: Educated on 3/3 back precautions. Required Braces or Orthoses: Spinal Brace Spinal Brace: Lumbar corset;Applied in sitting position Restrictions Weight Bearing Restrictions: No   Pertinent Vitals/Pain 7/10 in back. Pt repositioned and RN aware.      Mobility  Bed Mobility Bed Mobility: Rolling Right;Right Sidelying to Sit Rolling Right: 4: Min assist;With rail Right Sidelying to Sit: 4: Min assist;With rails;HOB flat Details for Bed Mobility Assistance: Assist due to back pain with cues for safest sequence to protect back. Transfers Transfers: Sit to Stand;Stand to Sit Sit to Stand: 4: Min assist;With upper extremity assist;From bed Stand to Sit: 4: Min assist;With upper extremity assist;To chair/3-in-1 Details for Transfer Assistance: Assist for balance with cues for tall posture and safe use of RW. Ambulation/Gait Ambulation/Gait Assistance: 4: Min assist Ambulation Distance (Feet): 120 Feet Assistive device: Rolling walker Ambulation/Gait Assistance Details: Assist for balance with cues for tall posture and safety. Gait Pattern: Decreased  stride length;Trunk flexed;Narrow base of support Stairs: No Wheelchair Mobility Wheelchair Mobility: No    Shoulder Instructions     Exercises     PT Diagnosis: Difficulty walking;Acute pain  PT Problem List: Decreased strength;Decreased activity tolerance;Decreased balance;Decreased mobility;Decreased knowledge of use of DME;Decreased knowledge of precautions;Pain PT Treatment Interventions: DME instruction;Gait training;Stair training;Functional mobility training;Therapeutic activities;Balance training;Patient/family education   PT Goals Acute Rehab PT Goals PT Goal Formulation: With patient Time For Goal Achievement: 06/16/12 Potential to Achieve Goals: Good Pt will Roll Supine to Right Side: with modified independence PT Goal: Rolling Supine to Right Side - Progress: Goal set today Pt will go Supine/Side to Sit: with modified independence PT Goal: Supine/Side to Sit - Progress: Goal set today Pt will go Sit to Supine/Side: with modified independence PT Goal: Sit to Supine/Side - Progress: Goal set today Pt will go Sit to Stand: with modified independence PT Goal: Sit to Stand - Progress: Goal set today Pt will go Stand to Sit: with modified independence PT Goal: Stand to Sit - Progress: Goal set today Pt will Ambulate: >150 feet;with modified independence;with least restrictive assistive device PT Goal: Ambulate - Progress: Goal set today Pt will Go Up / Down Stairs: 6-9 stairs;with supervision;with least restrictive assistive device PT Goal: Up/Down Stairs - Progress: Goal set today  Visit Information  Last PT Received On: 06/09/12 Assistance Needed: +1    Subjective Data  Subjective: "I need to learn how to behave." Patient Stated Goal: Go home.   Prior Functioning  Home Living Lives With: Spouse;Family Available Help at Discharge: Family;Available 24 hours/day Type of Home: Mobile home Home Access: Stairs to enter Entrance Stairs-Number of Steps: 6 Entrance  Stairs-Rails: Can reach both  Home Layout: One level Home Adaptive Equipment: None Prior Function Level of Independence: Independent Able to Take Stairs?: Yes Driving: Yes Vocation: Other (comment) (Working on disability.) Communication Communication: No difficulties Dominant Hand: Right    Cognition  Overall Cognitive Status: Appears within functional limits for tasks assessed/performed Arousal/Alertness: Awake/alert Orientation Level: Appears intact for tasks assessed Behavior During Session: [facility] for tasks performed    Extremity/Trunk Assessment Right Upper Extremity Assessment RUE ROM/Strength/Tone: Within functional levels RUE Sensation: WFL - Light Touch RUE Coordination: WFL - gross motor Left Upper Extremity Assessment LUE ROM/Strength/Tone: Within functional levels LUE Coordination: WFL - gross motor Right Lower Extremity Assessment RLE ROM/Strength/Tone: Within functional levels RLE Sensation: WFL - Light Touch RLE Coordination: WFL - gross motor Left Lower Extremity Assessment LLE ROM/Strength/Tone: Within functional levels LLE Sensation: WFL - Light Touch LLE Coordination: WFL - gross motor Trunk Assessment Trunk Assessment: Normal   Balance Balance Balance Assessed: No  End of Session PT - End of Session Equipment Utilized During Treatment: Gait belt;Back brace Activity Tolerance: Patient tolerated treatment well Patient left: in chair;with call bell/phone within reach;with nursing in room Nurse Communication: Mobility status;Patient requests pain meds  GP     Cephus Shelling 06/09/2012, 9:58 AM  06/09/2012 Cephus Shelling, PT, DPT [phone]

## 2012-06-09 NOTE — Progress Notes (Signed)
Subjective: Patient reports A lot of severe back pain but significant improvement in her leg pain  Objective: Vital signs in last 24 hours: Temp:  [97.4 F (36.3 C)-98.3 F (36.8 C)] 98.2 F (36.8 C) (10/31 0549) Pulse Rate:  [70-105] 102  (10/31 0549) Resp:  [9-21] 18  (10/31 0549) BP: (100-153)/(59-97) 116/88 mmHg (10/31 0549) SpO2:  [95 %-100 %] 98 % (10/31 0549) Weight:  [79.379 kg (175 lb)] 79.379 kg (175 lb) (10/31 0135)  Intake/Output from previous day: 10/30 0701 - 10/31 0700 In: 2200 [I.V.:2200] Out: 660 [Urine:310; Drains:50; Blood:300] Intake/Output this shift:    Strength out of 5 wound clean and dry  Lab Results:  Basename 06/08/12 1016  WBC 10.0  HGB 15.3*  HCT 43.4  PLT 157   BMET No results found for this basename: NA:2,K:2,CL:2,CO2:2,GLUCOSE:2,BUN:2,CREATININE:2,CALCIUM:2 in the last 72 hours  Studies/Results: Dg Lumbar Spine 2-3 Views  06/08/2012  *RADIOLOGY REPORT*  Clinical Data: L2-3 fusion.  DG C-ARM 1-60 MIN,LUMBAR SPINE - 2-3 VIEW  Technique: Two intraoperative C-arm views submitted for review after surgery.  Comparison:  Intraoperative exam 06/05/2011.  Findings: Remote L4-5 fusion.  Pedicle screws placed bilaterally at the L2 and L3 level. Interbody spacers at the L2-3 level.  No complication noted.  This can be assessed on follow-up.  IMPRESSION: Fusion L2-3 with remote fusion L4-5.   Original Report Authenticated By: Fuller Canada, M.D.    Dg C-arm 1-60 Min  06/08/2012  *RADIOLOGY REPORT*  Clinical Data: L2-3 fusion.  DG C-ARM 1-60 MIN,LUMBAR SPINE - 2-3 VIEW  Technique: Two intraoperative C-arm views submitted for review after surgery.  Comparison:  Intraoperative exam 06/05/2011.  Findings: Remote L4-5 fusion.  Pedicle screws placed bilaterally at the L2 and L3 level. Interbody spacers at the L2-3 level.  No complication noted.  This can be assessed on follow-up.  IMPRESSION: Fusion L2-3 with remote fusion L4-5.   Original Report Authenticated  By: Fuller Canada, M.D.     Assessment/Plan: Mobilized day with physical therapy try to wean her off her PCA DC her Foley  LOS: 1 day     Myonna Chisom P 06/09/2012, 7:34 AM

## 2012-06-09 NOTE — Plan of Care (Signed)
Problem: Consults Goal: Diagnosis - Spinal Surgery Thoraco/Lumbar Spine Fusion Redo Decompressive lumbar laminectomy, a posterior lumbar interbody fusion L2-3, and a  posterior lateral arthrodesis L2-3.

## 2012-06-09 NOTE — Progress Notes (Signed)
UR COMPLETED  

## 2012-06-09 NOTE — Evaluation (Signed)
Occupational Therapy Evaluation Patient Details Name: [name] MRN: [id number] DOB: 1966/07/02 Today's Date: 06/09/2012 Time: 4098-1191 OT Time Calculation (min): 30 min  OT Assessment / Plan / Recommendation Clinical Impression  Pt is recovering from lumbar fusion.  This is her 4th back surgery.  Pt has good support at home and is predicted to be able to return upon discharge.  Will follow acutely to address ADL transfers.  Do not anticipate pt will need OT upon discharge.    OT Assessment  Patient needs continued OT Services    Follow Up Recommendations  No OT follow up    Barriers to Discharge      Equipment Recommendations  3 in 1 bedside comode    Recommendations for Other Services    Frequency  Min 2X/week    Precautions / Restrictions Precautions Precautions: Back;Fall Precaution Booklet Issued: Yes (comment) Precaution Comments: Educated on 3/3 back precautions, reviewed handout Required Braces or Orthoses: Spinal Brace Spinal Brace: Lumbar corset;Applied in sitting position Restrictions Weight Bearing Restrictions: No   Pertinent Vitals/Pain 7/10 back, on PCA and premedicated    ADL  Eating/Feeding: Simulated;Independent Where Assessed - Eating/Feeding: Edge of bed Grooming: Wash/dry hands;Performed;Min guard Where Assessed - Grooming: Unsupported standing Upper Body Bathing: Simulated;Set up Where Assessed - Upper Body Bathing: Unsupported sitting Lower Body Bathing: Simulated;Supervision/safety Where Assessed - Lower Body Bathing: Supported sit to stand Upper Body Dressing: Performed;Set up Where Assessed - Upper Body Dressing: Unsupported sitting Lower Body Dressing: Performed;Supervision/safety Where Assessed - Lower Body Dressing: Supported sit to stand Toilet Transfer: Minimal assistance;Performed Statistician Method: Sit to Barista: Materials engineer and Hygiene: Performed;Min  guard Where Assessed - Engineer, mining and Hygiene: Sit to stand from 3-in-1 or toilet Equipment Used: Gait belt;Rolling walker Transfers/Ambulation Related to ADLs: min assist with RW ADL Comments: Pt able to cross her foot over the opposite knee with ease.      OT Diagnosis: Generalized weakness;Acute pain  OT Problem List: Decreased activity tolerance;Impaired balance (sitting and/or standing);Pain;Decreased knowledge of use of DME or AE;Decreased knowledge of precautions OT Treatment Interventions: Self-care/ADL training;DME and/or AE instruction;Patient/family education   OT Goals Acute Rehab OT Goals OT Goal Formulation: With patient Time For Goal Achievement: 06/16/12 Potential to Achieve Goals: Good ADL Goals Pt Will Transfer to Toilet: with modified independence;Ambulation;with DME;Maintaining back safety precautions ADL Goal: Toilet Transfer - Progress: Goal set today Pt Will Perform Toileting - Clothing Manipulation: with modified independence;Standing ADL Goal: Toileting - Clothing Manipulation - Progress: Goal set today Pt Will Perform Toileting - Hygiene: with modified independence;Sit to stand from 3-in-1/toilet ADL Goal: Toileting - Hygiene - Progress: Goal set today Pt Will Perform Tub/Shower Transfer: Shower transfer;with supervision;Ambulation;with DME ADL Goal: Tub/Shower Transfer - Progress: Goal set today Miscellaneous OT Goals Miscellaneous OT Goal #1: Pt will generalize back precautions in ADL independently. OT Goal: Miscellaneous Goal #1 - Progress: Goal set today  Visit Information  Last OT Received On: 06/09/12 Assistance Needed: +1    Subjective Data  Subjective: "I have 2 teenagers at home that can help me." Patient Stated Goal: Return home with family.   Prior Functioning     Home Living Lives With: Spouse;Family Available Help at Discharge: Family;Available 24 hours/day Type of Home: Mobile home Home Access: Stairs to  enter Entrance Stairs-Number of Steps: 6 Entrance Stairs-Rails: Can reach both Home Layout: One level Bathroom Shower/Tub: Walk-in shower;Door [employer] Toilet: Standard Home Adaptive Equipment: None Prior Function Level  of Independence: Independent Able to Take Stairs?: Yes Driving: Yes Vocation: Other (comment) (applying for disability) Communication Communication: No difficulties Dominant Hand: Right         Vision/Perception     Cognition  Overall Cognitive Status: Appears within functional limits for tasks assessed/performed Arousal/Alertness: Awake/alert Orientation Level: Appears intact for tasks assessed Behavior During Session: [facility] for tasks performed    Extremity/Trunk Assessment Right Upper Extremity Assessment RUE ROM/Strength/Tone: WFL for tasks assessed RUE Sensation: WFL - Light Touch RUE Coordination: WFL - gross motor Left Upper Extremity Assessment LUE ROM/Strength/Tone: WFL for tasks assessed LUE Coordination: WFL - gross motor  Trunk Assessment Trunk Assessment: Normal     Mobility Bed Mobility Bed Mobility: Rolling Right;Right Sidelying to Sit Rolling Right: 4: Min assist;With rail Right Sidelying to Sit: 4: Min assist;With rails;HOB flat Details for Bed Mobility Assistance: verbal cues for technique Transfers Transfers: Sit to Stand;Stand to Sit Sit to Stand: 4: Min assist;With upper extremity assist;From bed;From chair/3-in-1 Stand to Sit: 4: Min assist;With upper extremity assist;To chair/3-in-1 Details for Transfer Assistance: Assist for balance with cues for tall posture and safe use of RW.     Shoulder Instructions     Exercise     Balance Balance Balance Assessed: No   End of Session OT - End of Session Activity Tolerance: Patient tolerated treatment well Patient left: in bed;with call bell/phone within reach  GO     Evern Bio 06/09/2012, 12:42 PM [phone]

## 2012-06-10 MED ORDER — WHITE PETROLATUM GEL: Status: DC | PRN

## 2012-06-10 NOTE — Progress Notes (Signed)
Occupational Therapy Treatment Patient Details Name: [name] MRN: [id number] DOB: 01-Oct-1965 Today's Date: 06/10/2012 Time: 4098-1191 OT Time Calculation (min): 28 min  OT Assessment / Plan / Recommendation Comments on Treatment Session Pt is POD2 s/p TLIF. Pt limited by significant pain this session.    Follow Up Recommendations  No OT follow up    Barriers to Discharge       Equipment Recommendations  3 in 1 bedside comode    Recommendations for Other Services    Frequency Min 2X/week   Plan Discharge plan remains appropriate    Precautions / Restrictions Precautions Precautions: Fall;Back Required Braces or Orthoses: Spinal Brace Spinal Brace: Lumbar corset;Applied in sitting position Restrictions Weight Bearing Restrictions: No   Pertinent Vitals/Pain Pt reported 9/10 pain in low back with all mobility. Repositioned and RN made aware.    ADL  Toilet Transfer: Performed;Min Pension scheme manager Method: Sit to Barista: Raised toilet seat with arms (or 3-in-1 over toilet) Toileting - Clothing Manipulation and Hygiene: Performed;Minimal assistance ADL Comments: Pt was unable to cross foot over opposite knee today 2* pain. Pt stated her daughters would be assisting. Unable to perform shower transfer. Pt began yelling, crying out in pain after transferring off commode. Pt was also unable to reach back to wipe back peri area.    OT Diagnosis:    OT Problem List:   OT Treatment Interventions:     OT Goals ADL Goals ADL Goal: Toilet Transfer - Progress: Progressing toward goals ADL Goal: Toileting - Clothing Manipulation - Progress: Progressing toward goals ADL Goal: Toileting - Hygiene - Progress: Not progressing Miscellaneous OT Goals OT Goal: Miscellaneous Goal #1 - Progress: Progressing toward goals  Visit Information  Last OT Received On: 06/10/12 Assistance Needed: +1    Subjective Data  Subjective: This pain is just killing  me! I need to lay down...   Prior Functioning       Cognition  Overall Cognitive Status: Appears within functional limits for tasks assessed/performed Arousal/Alertness: Awake/alert Orientation Level: Appears intact for tasks assessed Behavior During Session: [facility] for tasks performed    Mobility  Shoulder Instructions Bed Mobility Bed Mobility: Sit to Sidelying Right Rolling Right: 4: Min assist Sit to Sidelying Right: 4: Min guard;HOB flat Details for Bed Mobility Assistance: Pt was able to complete without use of rail. Physical A needed for LEs when rolling. Min cues for technique. Transfers Sit to Stand: 4: Min guard;From chair/3-in-1;With upper extremity assist Stand to Sit: 4: Min guard;With upper extremity assist;To chair/3-in-1;To bed Details for Transfer Assistance: Min cues for hand placement and safe use of RW.       Exercises      Balance     End of Session OT - End of Session Activity Tolerance: Patient limited by pain Patient left: in bed;with call bell/phone within reach Nurse Communication: Patient requests pain meds  GO     Deroy Noah A OTR/L [phone] 06/10/2012, 9:12 AM

## 2012-06-10 NOTE — Progress Notes (Signed)
[name], [place] [phone] 06/10/2012

## 2012-06-10 NOTE — Progress Notes (Signed)
PT Cancellation Note  Patient Details Name: [name] MRN: [id number] DOB: March 21, 1966   Cancelled Treatment:     Have attempted twice this AM, pt. Refusing d/t pain both times; Pt. Has received pain medications. Will try back later today, if time allows.    Mertie Clause , SPTA 06/10/2012, 10:55 AM

## 2012-06-10 NOTE — Progress Notes (Signed)
Subjective: Patient reports Severe back pain legs still feel better than 3 but very sedated after getting a couple doses of Dilaudid at 2:00 seems that she's getting over medicated  Objective: Vital signs in last 24 hours: Temp:  [97.9 F (36.6 C)-99.9 F (37.7 C)] 99.9 F (37.7 C) (11/01 1509) Pulse Rate:  [74-98] 98  (11/01 1509) Resp:  [18-20] 20  (11/01 1509) BP: (98-107)/(56-64) 104/63 mmHg (11/01 1509) SpO2:  [95 %-99 %] 95 % (11/01 1509)  Intake/Output from previous day: 10/31 0701 - 11/01 0700 In: 1350 [P.O.:900; IV Piggyback:450] Out: 170 [Drains:170] Intake/Output this shift:    Awake alert oriented strength 5 out of 5 wound clean and dry  Lab Results:  Basename 06/08/12 1016  WBC 10.0  HGB 15.3*  HCT 43.4  PLT 157   BMET No results found for this basename: NA:2,K:2,CL:2,CO2:2,GLUCOSE:2,BUN:2,CREATININE:2,CALCIUM:2 in the last 72 hours  Studies/Results: No results found.  Assessment/Plan: Progressive mobilization continued physical therapy discontinue the OxyIR discontinue soma we'll leave her on Flexeril and the Dilaudid tablets that should not oversedated her  LOS: 2 days     Kaynen Minner P 06/10/2012, 4:55 PM

## 2012-06-10 NOTE — Progress Notes (Signed)
Pt very restless and screaming having a lot of pain she said  Pain med given as odered

## 2012-06-11 MED ORDER — MAGNESIUM CITRATE PO SOLN
1.0000 | Freq: Once | ORAL | Status: AC
  Administered 2012-06-11: 1 via ORAL
  Filled 2012-06-11: qty 296

## 2012-06-11 MED ORDER — OXYCODONE HCL 5 MG PO TABS
5.0000 mg | ORAL_TABLET | ORAL | Status: DC | PRN
  Administered 2012-06-11 (×2): 15 mg via ORAL
  Administered 2012-06-11: 10 mg via ORAL
  Administered 2012-06-12 (×3): 15 mg via ORAL
  Filled 2012-06-11 (×8): qty 3

## 2012-06-11 MED ORDER — CARISOPRODOL 350 MG PO TABS
350.0000 mg | ORAL_TABLET | Freq: Three times a day (TID) | ORAL | Status: DC | PRN
  Administered 2012-06-12: 350 mg via ORAL
  Filled 2012-06-11: qty 1

## 2012-06-11 NOTE — Progress Notes (Signed)
Patient pain not controlled with current regimen of 2-4 mg of PO Dilaudid. Discussed with Dr. Jeral Fruit received telephone order to discontinue PO Dilaudid and add PO Oxycodone immediate release 5-15mg  every 3 hours PRN for moderate to severe pain. Will continue to monitor patient.

## 2012-06-11 NOTE — Progress Notes (Signed)
Patient ID: [name], female   DOB: 29-Mar-1966, 46 y.o.   MRN: [id number] BP 107/63  Pulse 88  Temp 99.1 F (37.3 C) (Oral)  Resp 18  Ht 5\' 6"  (1.676 m)  Wt 79.379 kg (175 lb)  BMI 28.25 kg/m2  SpO2 100% Alert and oriented x 4 Wound is clean and dry Moving slowly. Drain came out last night, no problems.  Continue pt

## 2012-06-11 NOTE — Progress Notes (Signed)
Occupational Therapy Treatment Patient Details Name: [name] MRN: [id number] DOB: 1965-09-16 Today's Date: 06/11/2012 Time: 4098-1191 OT Time Calculation (min): 10 min  OT Assessment / Plan / Recommendation Comments on Treatment Session Pt continuing to experience significant pain and refusing to perform OOB transfer during this session.  Pt reports she has been ambulating to bathroom with RN assist. Encouraged pt to sit up in chair today.  Will continue to follow to ensure increased independence with ADLs before d/c home.    Follow Up Recommendations  No OT follow up    Barriers to Discharge       Equipment Recommendations  3 in 1 bedside comode    Recommendations for Other Services    Frequency Min 2X/week   Plan Discharge plan remains appropriate    Precautions / Restrictions Precautions Precautions: Back Precaution Comments: Reviewed back precautions with pt. Required Braces or Orthoses: Spinal Brace Spinal Brace: Lumbar corset;Applied in sitting position   Pertinent Vitals/Pain See vitals    ADL  Toileting - Clothing Manipulation and Hygiene:  (educated pt on technique) Equipment Used:  (none) Transfers/Ambulation Related to ADLs: Pt refused due to pain ADL Comments: Pt declining need for toileting ADL during session.  Pt reporting that she is having difficulty with back peri care because she feels like her back is arching.  Educated pt on use of toilet aid (open faced tongs) to maximize independence.  Pt verbalized understanding and very appreciative of information.  Pt performed bed moblity in order to acheive better position in bed.      OT Diagnosis:    OT Problem List:   OT Treatment Interventions:     OT Goals ADL Goals Pt Will Perform Toileting - Hygiene: with modified independence;Sit to stand from 3-in-1/toilet ADL Goal: Toileting - Hygiene - Progress: Progressing toward goals Miscellaneous OT Goals Miscellaneous OT Goal #1: Pt will generalize back  precautions in ADL independently. OT Goal: Miscellaneous Goal #1 - Progress: Progressing toward goals  Visit Information  Last OT Received On: 06/11/12 Assistance Needed: +1    Subjective Data      Prior Functioning       Cognition  Overall Cognitive Status: Appears within functional limits for tasks assessed/performed Arousal/Alertness: Awake/alert Orientation Level: Appears intact for tasks assessed Behavior During Session: [facility] for tasks performed    Mobility  Shoulder Instructions Bed Mobility Bed Mobility: Rolling Left;Left Sidelying to Sit;Sit to Sidelying Left Rolling Left: 5: Supervision Left Sidelying to Sit: 4: Min guard Sit to Sidelying Left: 4: Min guard Details for Bed Mobility Assistance: Min guard at trunk for support if needed.  VC for technique. Transfers Transfers: Not assessed       Exercises      Balance     End of Session OT - End of Session Equipment Utilized During Treatment:  (none) Activity Tolerance: Patient limited by pain Patient left: in bed;with call bell/phone within reach Nurse Communication: Patient requests pain meds  GO    06/11/2012 Cipriano Mile OTR/L Pager [phone] Office [phone]  Cipriano Mile 06/11/2012, 12:27 PM

## 2012-06-11 NOTE — Progress Notes (Signed)
Physical Therapy Treatment Patient Details Name: [name] MRN: [id number] DOB: Jan 09, 1966 Today's Date: 06/11/2012 Time: 4098-1191 PT Time Calculation (min): 24 min  PT Assessment / Plan / Recommendation Comments on Treatment Session  Patient progressing with mobility.  Will try stairs at next session.    Follow Up Recommendations  Home health PT     Does the patient have the potential to tolerate intense rehabilitation     Barriers to Discharge        Equipment Recommendations  Rolling walker with 5" wheels    Recommendations for Other Services    Frequency Min 5X/week   Plan Discharge plan remains appropriate;Frequency remains appropriate    Precautions / Restrictions Precautions Precautions: Back Precaution Comments: Patient recalls 3/3 back precautions Required Braces or Orthoses: Spinal Brace Spinal Brace: Lumbar corset;Applied in sitting position (Patient able to apply independently) Restrictions Weight Bearing Restrictions: No   Pertinent Vitals/Pain     Mobility  Bed Mobility Bed Mobility: Right Sidelying to Sit;Rolling Right;Sit to Sidelying Left Rolling Right: 5: Supervision;With rail Right Sidelying to Sit: 5: Supervision;With rails;HOB flat Sit to Sidelying Left: 5: Supervision;With rail;HOB flat Details for Bed Mobility Assistance: Patient maintains back precautions during mobility Transfers Transfers: Sit to Stand;Stand to Sit Sit to Stand: 5: Supervision;From bed Stand to Sit: 5: Supervision;To bed Details for Transfer Assistance: Verbal cues for hand placement.  Uses good technique. Ambulation/Gait Ambulation/Gait Assistance: 4: Min guard Ambulation Distance (Feet): 200 Feet Assistive device: Rolling walker Ambulation/Gait Assistance Details: Verbal cues to stand tall during gait.  Safe use of RW. Gait Pattern: Step-through pattern;Decreased stride length;Trunk flexed Gait velocity: Slow gait speed Stairs: No (Patient declined)      PT  Goals Acute Rehab PT Goals PT Goal: Rolling Supine to Right Side - Progress: Progressing toward goal PT Goal: Supine/Side to Sit - Progress: Progressing toward goal PT Goal: Sit to Supine/Side - Progress: Progressing toward goal PT Goal: Sit to Stand - Progress: Progressing toward goal PT Goal: Stand to Sit - Progress: Progressing toward goal PT Goal: Ambulate - Progress: Progressing toward goal  Visit Information  Last PT Received On: 06/11/12 Assistance Needed: +1    Subjective Data  Subjective: "I can walk, but not do the stairs"   Cognition  Overall Cognitive Status: Appears within functional limits for tasks assessed/performed Arousal/Alertness: Awake/alert Orientation Level: Appears intact for tasks assessed Behavior During Session: [facility] for tasks performed    Balance     End of Session PT - End of Session Equipment Utilized During Treatment: Gait belt;Back brace Activity Tolerance: Patient tolerated treatment well Patient left: in bed;with call bell/phone within reach Nurse Communication: Mobility status   GP     Vena Austria 06/11/2012, 5:34 PM Durenda Hurt. Renaldo Fiddler, [facility] Acute Rehab Services Pager [phone]

## 2012-06-12 NOTE — Progress Notes (Signed)
   CARE MANAGEMENT NOTE 06/12/2012  Patient:  [name], [name]   Account Number:  [id number]  Date Initiated:  06/12/2012  Documentation initiated by:  [facility]  Subjective/Objective Assessment:   lumbar fusion     Action/Plan:   Anticipated DC Date:  06/12/2012   Anticipated DC Plan:  HOME/SELF CARE      DC Planning Services  CM consult      Choice offered to / List presented to:     DME arranged  Levan Hurst      DME agency  [facility]        Status of service:  Completed, signed off Medicare Important Message given?   (If response is "NO", the following Medicare IM given date fields will be blank) Date Medicare IM given:   Date Additional Medicare IM given:    Discharge Disposition:  HOME/SELF CARE  Per UR Regulation:    If discussed at Long Length of Stay Meetings, dates discussed:    Comments:

## 2012-06-12 NOTE — Discharge Summary (Signed)
Physician Discharge Summary  Patient ID: [name] MRN: [id number] DOB/AGE: 46-12-1965 46 y.o.  Admit date: 06/08/2012 Discharge date: 06/12/2012  Admission Diagnoses:DDD LUMBAR SPINE  Discharge Diagnoses: SAME   Discharged Condition: AMBULATING Hospital Course:surgery  Consults: none  Significant Diagnostic Studies: mri  Treatments: lumbar fusion  Discharge Exam: Blood pressure 97/59, pulse 105, temperature 98.5 F (36.9 C), temperature source Oral, resp. rate 18, height 5\' 6"  (1.676 m), weight 79.379 kg (175 lb), SpO2 92.00%. Ambulating. No weakness  Disposition: home    Medication List     As of 06/12/2012 10:34 AM    ASK your doctor about these medications         carisoprodol 350 MG tablet   Commonly known as: SOMA   Take 350 mg by mouth 1 day or 1 dose.      DULoxetine 30 MG capsule   Commonly known as: CYMBALTA   Take 30 mg by mouth daily.      HYDROcodone-acetaminophen 5-325 MG per tablet   Commonly known as: NORCO/VICODIN   Take 1-2 tablets by mouth every 6 (six) hours as needed. For pain      sodium chloride 0.65 % nasal spray   Commonly known as: OCEAN   Place 2 sprays into the nose daily as needed. For nasal congestion         Signed: Jaquasia Doscher M 06/12/2012, 10:34 AM

## 2012-06-12 NOTE — Progress Notes (Signed)
Physical Therapy Treatment Patient Details Name: [name] MRN: [id number] DOB: [birth year] Today's Date: 06/12/2012 Time: 4098-1191 PT Time Calculation (min): 24 min  PT Assessment / Plan / Recommendation Comments on Treatment Session  Pt is supervision to mod I for all mobility. Plan to d/c home today.    Follow Up Recommendations  No PT follow up     Does the patient have the potential to tolerate intense rehabilitation     Barriers to Discharge        Equipment Recommendations  Rolling walker with 5" wheels    Recommendations for Other Services    Frequency Min 5X/week   Plan Discharge plan remains appropriate;Frequency remains appropriate    Precautions / Restrictions Precautions Precautions: Back Precaution Comments: Patient recalls 3/3 back precautions Required Braces or Orthoses: Spinal Brace Spinal Brace: Lumbar corset;Applied in sitting position Restrictions Weight Bearing Restrictions: No   Pertinent Vitals/Pain Minimal complaints of pain.    Mobility  Bed Mobility Bed Mobility: Right Sidelying to Sit;Rolling Right;Sit to Sidelying Left Rolling Right: 6: Modified independent (Device/Increase time) Right Sidelying to Sit: 6: Modified independent (Device/Increase time) Sit to Sidelying Right: 5: Supervision Details for Bed Mobility Assistance: Minimal cueing for proper sequencing, pt moving extyremely well and maintaining back precautions Transfers Transfers: Sit to Stand;Stand to Sit Sit to Stand: 6: Modified independent (Device/Increase time) Stand to Sit: 6: Modified independent (Device/Increase time) Ambulation/Gait Ambulation/Gait Assistance: 5: Supervision Ambulation Distance (Feet): 400 Feet Assistive device: Rolling walker Ambulation/Gait Assistance Details: Cueing for safe technqiue and close distnace to RW.  Gait Pattern: Step-through pattern;Decreased stride length;Trunk flexed Gait velocity: Slow gait speed Stairs: Yes Stairs  Assistance: 4: Min guard Stair Management Technique: Two rails;Step to pattern;Forwards Number of Stairs: 5     Exercises     PT Diagnosis:    PT Problem List:   PT Treatment Interventions:     PT Goals Acute Rehab PT Goals PT Goal: Rolling Supine to Right Side - Progress: Met PT Goal: Supine/Side to Sit - Progress: Progressing toward goal PT Goal: Sit to Supine/Side - Progress: Met PT Goal: Sit to Stand - Progress: Met PT Goal: Stand to Sit - Progress: Met PT Goal: Ambulate - Progress: Progressing toward goal PT Goal: Up/Down Stairs - Progress: Progressing toward goal  Visit Information  Last PT Received On: 06/12/12 Assistance Needed: +1    Subjective Data      Cognition  Overall Cognitive Status: Appears within functional limits for tasks assessed/performed Arousal/Alertness: Awake/alert Orientation Level: Appears intact for tasks assessed Behavior During Session: [facility] for tasks performed    Balance     End of Session PT - End of Session Equipment Utilized During Treatment: Gait belt;Back brace Activity Tolerance: Patient tolerated treatment well Patient left: in bed;with call bell/phone within reach Nurse Communication: Mobility status   GP     Milana Kidney 06/12/2012, 11:03 AM

## 2012-06-15 MED FILL — Heparin Sodium (Porcine) Inj 1000 Unit/ML: INTRAMUSCULAR | Qty: 30 | Status: AC

## 2012-12-17 IMAGING — CT CT CHEST W/ CM
3 series · 17 of 29 positions shown, 19 images · IV contrast (100ml omni 300)
Comparison: None.

CLINICAL DATA: Abnormal chest x-ray.  Evaluate for mass.

CT CHEST WITH CONTRAST
TECHNIQUE: Multidetector CT imaging of the chest was performed
following the standard protocol during bolus administration of
intravenous contrast.
Contrast: 100mL OMNIPAQUE IOHEXOL 300 MG/ML  SOLN

[Series 2: routine chest · axial · 0.68mm/px · z∈[-284,-54]mm · 7 of 66 slices shown, 9 images]
[im 10/66  mediastinal]
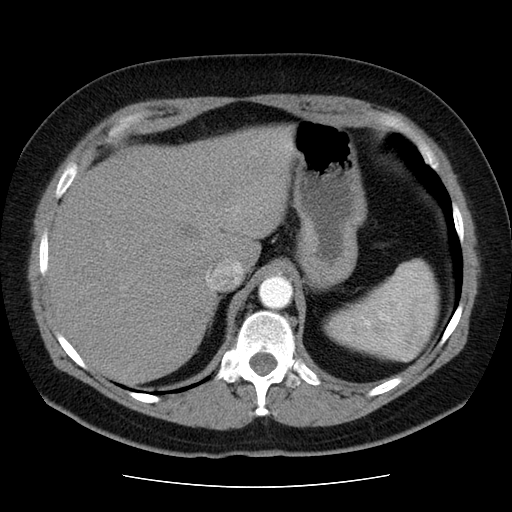
[im 10/66  lung]
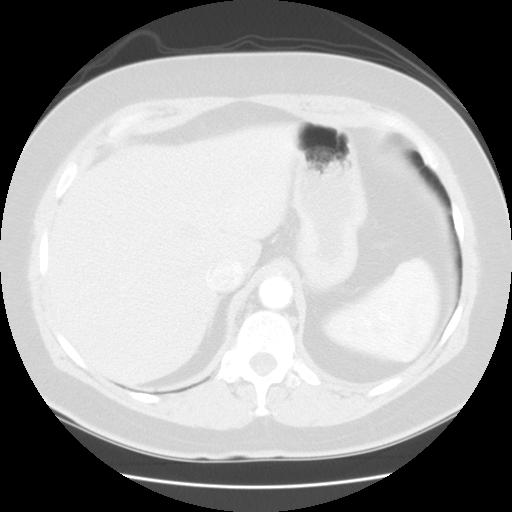
[im 19/66  lung]
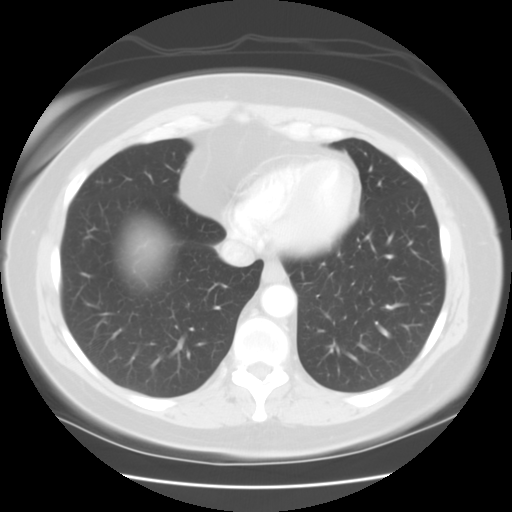
[im 28/66  lung]
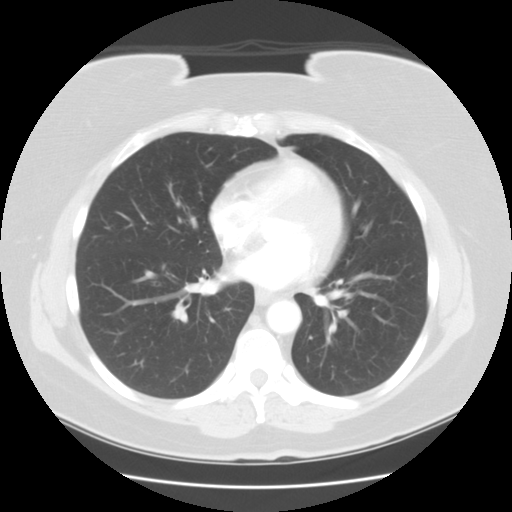
[im 33/66  lung]
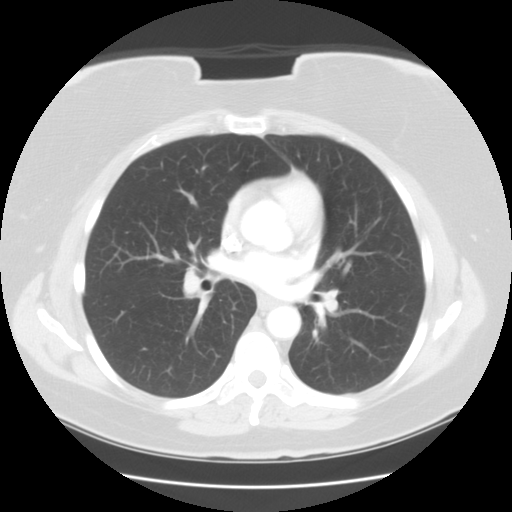
[im 38/66  mediastinal]
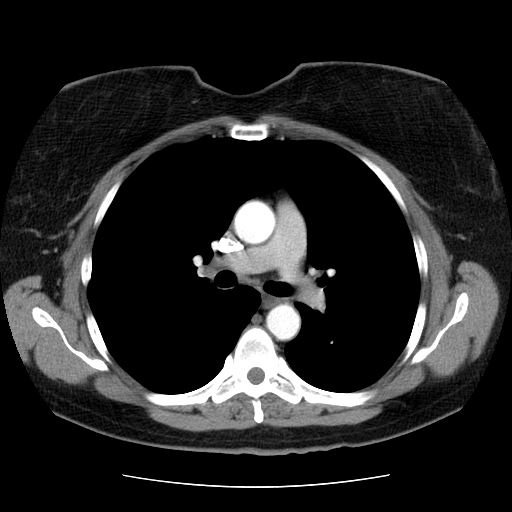
[im 38/66  lung]
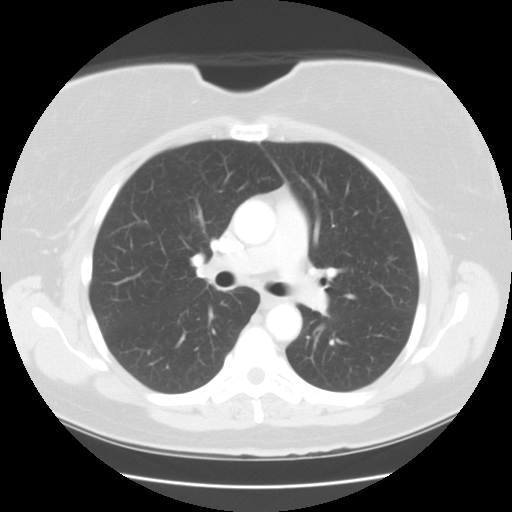
[im 47/66  lung]
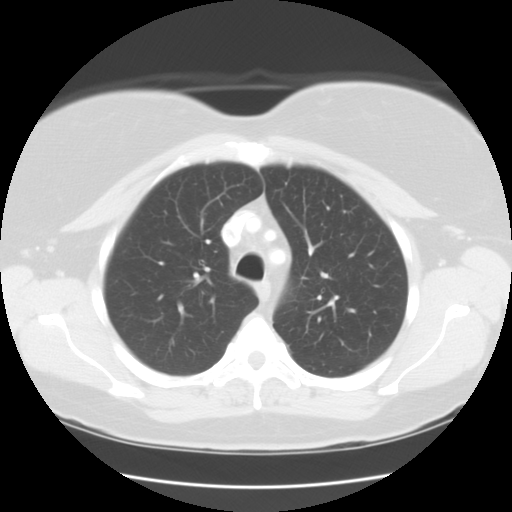
[im 56/66  lung]
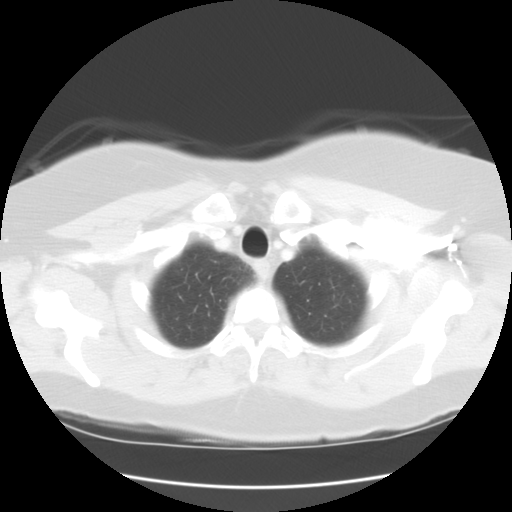

[Series 400: coronals · coronal · 0.68mm/px · 2 of 81 slices shown]
[im 11/81  lung]
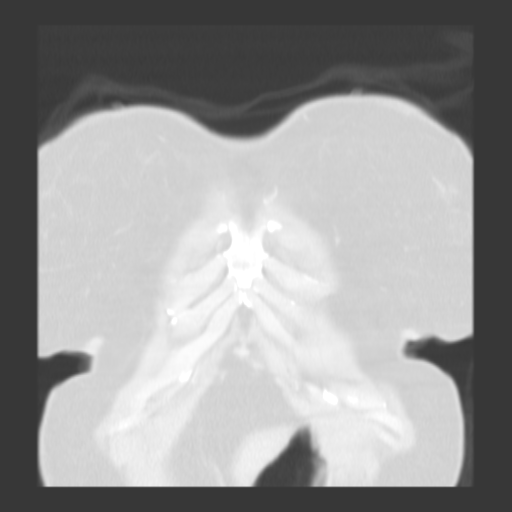
[im 21/81  lung]
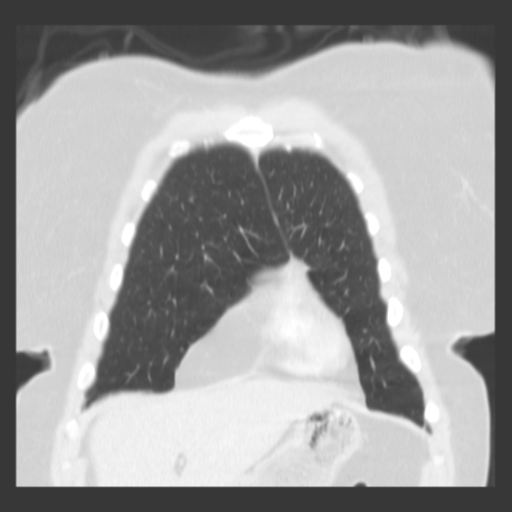

[Series 401: sagittals · sagittal · 0.68mm/px · 8 of 102 slices shown]
[im 11/102  lung]
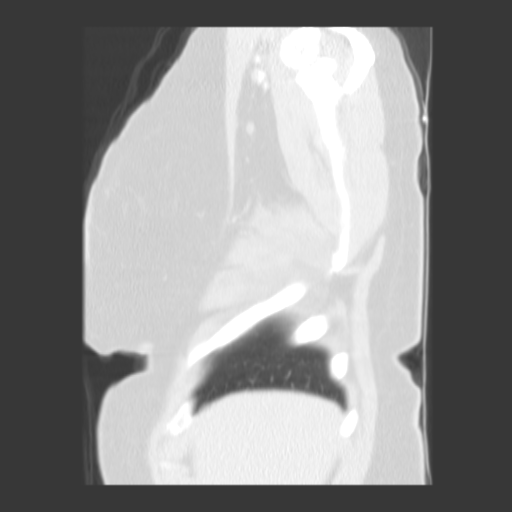
[im 21/102  lung]
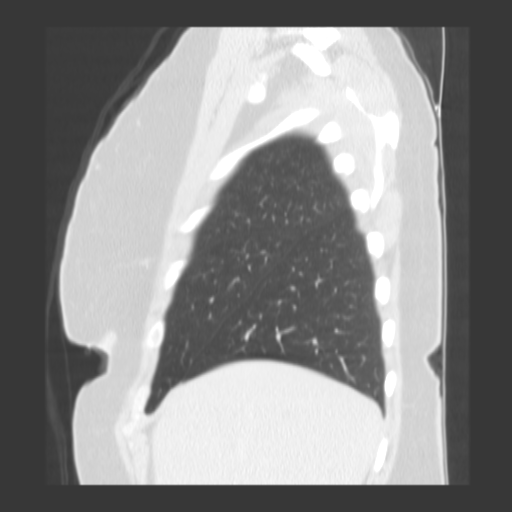
[im 31/102  lung]
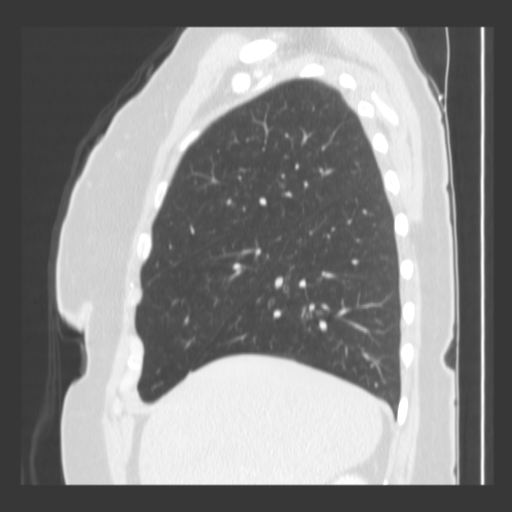
[im 41/102  lung]
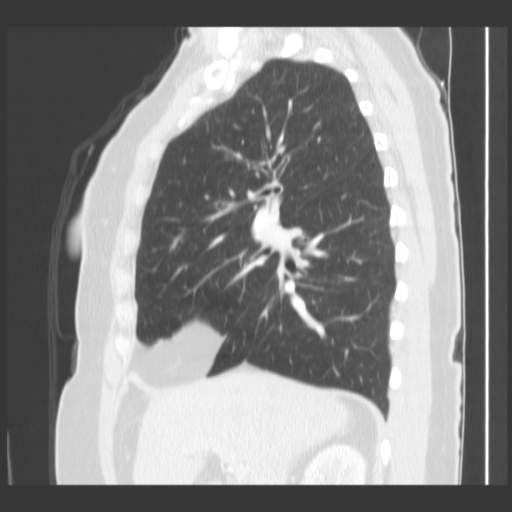
[im 61/102  lung]
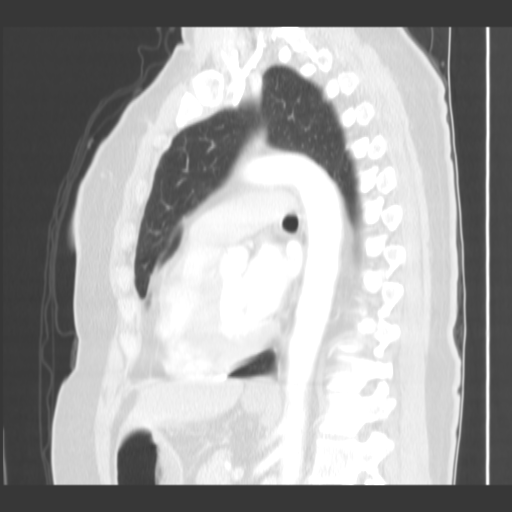
[im 71/102  lung]
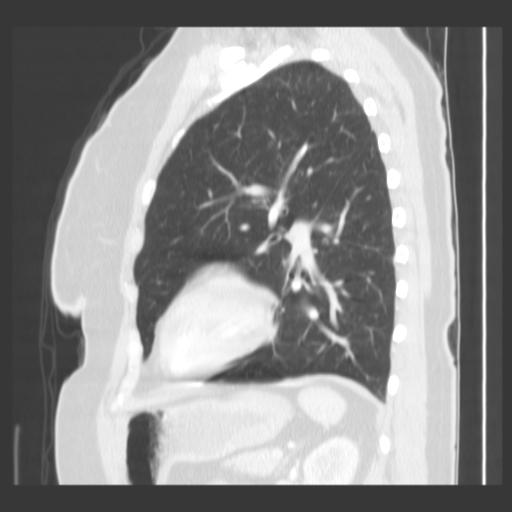
[im 81/102  lung]
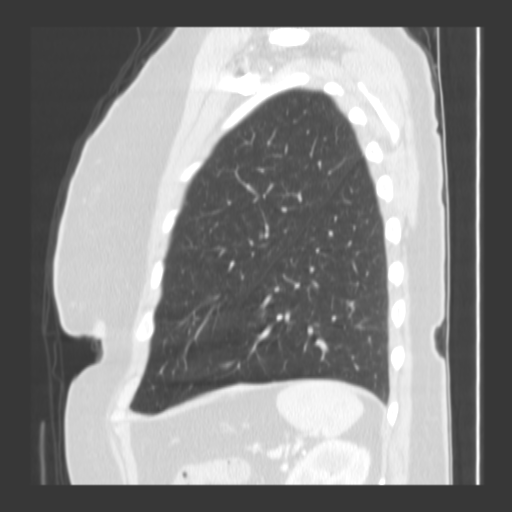
[im 91/102  lung]
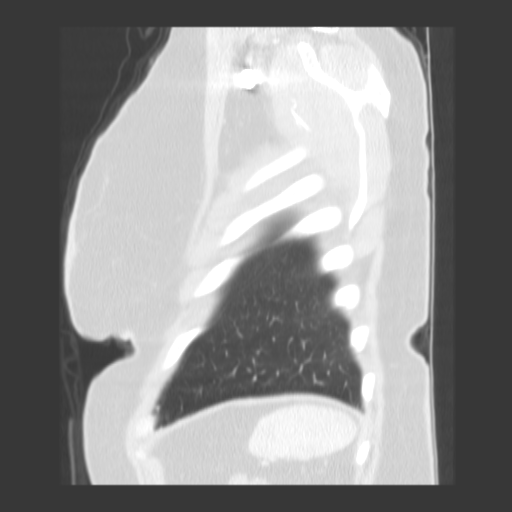

[17 of 29 positions shown; findings below may reference images not displayed]

FINDINGS: There is no axillary or supraclavicular adenopathy.  No
mediastinal or hilar adenopathy.  No pericardial or pleural
effusion. Prominent cardiophrenic angle fat accounts for the "mass-
like" opacity identified on recent chest radiograph.

The lungs are clear.  No suspicious pulmonary nodule or mass
identified.  The tracheobronchial tree is patent.

Limited imaging through the upper abdomen shows changes from prior
cholecystectomy.

The patient is status post anterior cervical disc fusion.
IMPRESSION: 1.  No active cardiopulmonary abnormalities.
2.  Opacity identified on recent chest radiograph reflects
prominent cardiophrenic angle fat.  No mass is identified..

## 2020-06-10 ENCOUNTER — Ambulatory Visit (INDEPENDENT_AMBULATORY_CARE_PROVIDER_SITE_OTHER): Payer: Medicaid Other | Admitting: Obstetrics & Gynecology

## 2020-06-10 ENCOUNTER — Encounter: Payer: Self-pay | Admitting: Obstetrics & Gynecology

## 2020-06-10 VITALS — Wt 198.0 lb

## 2020-06-10 DIAGNOSIS — N9089 Other specified noninflammatory disorders of vulva and perineum: Secondary | ICD-10-CM | POA: Diagnosis not present

## 2020-06-10 NOTE — Progress Notes (Signed)
Patient ID: [name], female   DOB: [birth year], 54 y.o.   MRN: [id number]  Chief Complaint  Patient presents with  . Labial Lesion    itchy,painful    HPI [name] is a 54 y.o. female.   HPI Indication: itchy irritated erythematous tissue for 6 months or so worsening the last 2 months in symptoms of the vulva Symptoms:   pruritic and not painful and none  Location:  labia majora on the left  Past Medical History:  Diagnosis Date  . Arthritis   . Chronic back pain   . Cold   . Depression   . Fibromyalgia    dx yrs ago no tx  . GERD (gastroesophageal reflux disease)    occ  . Hypertension   . Hypertriglyceridemia   . Hypothyroidism   . No pertinent past medical history     Past Surgical History:  Procedure Laterality Date  . ABDOMINAL HYSTERECTOMY    . BACK SURGERY     2008 FUSION, 2010 [facility] REMOVAL, 2012   . CARPAL TUNNEL RELEASE     BILAT 2007   . CERVICAL FUSION     2006  . CHOLECYSTECTOMY     2004  . HEMORROIDECTOMY     2004  . ULNAR TUNNEL RELEASE     LEFT ARM 2010     History reviewed. No pertinent family history.  Social History Social History   Tobacco Use  . Smoking status: Former Smoker    Packs/day: 1.00    Years: 20.00    Pack years: 20.00    Types: Cigarettes  . Smokeless tobacco: Never Used  Vaping Use  . Vaping Use: Never used  Substance Use Topics  . Alcohol use: Yes    Comment: occasional  . Drug use: No    Allergies  Allergen Reactions  . Hydrocodone Other (See Comments)    hyper  . Codeine     Hyper, anxious, can't sleep  . Ibuprofen Other (See Comments)    GI     Current Outpatient Medications  Medication Sig Dispense Refill  . EUTHYROX 75 MCG tablet Take 75 mcg by mouth daily.    . metoprolol tartrate (LOPRESSOR) 25 MG tablet Take 25 mg by mouth 2 (two) times daily.    . naproxen (NAPROSYN) 500 MG tablet Take by mouth.    . nystatin cream (MYCOSTATIN) Apply 1 application topically 2 (two) times daily.     . sodium chloride (OCEAN) 0.65 % nasal spray Place 2 sprays into the nose daily as needed. For nasal congestion     No current facility-administered medications for this visit.    Review of Systems Review of Systems  Weight 198 lb (89.8 kg).  Physical Exam Physical Exam Pruritic plaque left labia majora  Data Reviewed yes  Assessment    Prepping with Betadine Local anesthesia with 1% Buffered Lidocaine 4  mm punch biopsy performed per protocol Single 3-0 chromic figure of eight suture placed with good hemostasis Well tolerated  Specimen appropriately identified and sent to pathology    Plan    Follow-up:  1 weeks      Lazaro Arms 06/10/2020, 10:27 AM

## 2020-06-10 NOTE — Addendum Note (Signed)
Addended by: Moss Mc on: 06/10/2020 11:48 AM   Modules accepted: Orders

## 2020-06-17 ENCOUNTER — Encounter: Payer: Self-pay | Admitting: Obstetrics & Gynecology

## 2020-06-17 ENCOUNTER — Ambulatory Visit (INDEPENDENT_AMBULATORY_CARE_PROVIDER_SITE_OTHER): Payer: Medicaid Other | Admitting: Obstetrics & Gynecology

## 2020-06-17 VITALS — BP 160/92 | HR 66

## 2020-06-17 DIAGNOSIS — D071 Carcinoma in situ of vulva: Secondary | ICD-10-CM | POA: Diagnosis not present

## 2020-06-17 NOTE — Progress Notes (Signed)
Follow up appointment for results  Chief Complaint  Patient presents with  . Follow-up    Blood pressure (!) 160/92, pulse 66.  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   Orders for this encounter: No orders of the defined types were placed in this encounter.   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       Face to face time:  10 minutes  Greater than 50% of the visit time was spent in counseling and coordination of care with the patient.  The summary and outline of the counseling and care coordination is summarized in the note above.   All questions were answered.  Past Medical History:  Diagnosis Date  . Arthritis   . Chronic back pain   . Cold   . Depression   . Fibromyalgia    dx yrs ago no tx  . GERD (gastroesophageal reflux disease)    occ  . Hypertension   . Hypertriglyceridemia   . Hypothyroidism   . No pertinent past medical history     Past Surgical History:  Procedure Laterality Date  . ABDOMINAL HYSTERECTOMY    . BACK SURGERY     2008 FUSION, 2010 [facility] REMOVAL, 2012   . CARPAL TUNNEL RELEASE     BILAT 2007   . CERVICAL FUSION     2006  . CHOLECYSTECTOMY     2004  . HEMORROIDECTOMY     2004  . ULNAR TUNNEL RELEASE     LEFT ARM 2010     OB History     Gravida  4   Para      Term      Preterm      AB      Living  4     SAB      TAB      Ectopic      Multiple      Live Births              Allergies  Allergen Reactions  . Hydrocodone Other (See Comments)    hyper  . Codeine     Hyper, anxious, can't sleep  . Ibuprofen Other (See Comments)    GI     Social History   Socioeconomic History  . Marital status: Single    Spouse name: Not on file  . Number of children: Not on file  . Years of education: Not on file  . Highest education level: Not on file  Occupational History  . Not on file  Tobacco Use  . Smoking status: Former Smoker    Packs/day: 1.00    Years: 20.00    Pack years: 20.00    Types: Cigarettes  . Smokeless tobacco: Never Used  Vaping Use  . Vaping Use: Never used  Substance and Sexual  Activity  . Alcohol use: Yes    Comment: occasional  . Drug use: No  . Sexual activity: Yes    Birth control/protection: Surgical  Other Topics Concern  . Not on file  Social History Narrative  . Not on file   Social Determinants of Health   Financial Resource Strain: Low Risk   . Difficulty of Paying Living Expenses: Not very hard  Food Insecurity: Food Insecurity Present  . Worried About Programme researcher, broadcasting/film/video in the Last Year: Sometimes true  . Ran Out of Food in the Last Year: Sometimes true  Transportation Needs: No Transportation Needs  . Lack of Transportation (Medical): No  . Lack of Transportation (Non-Medical): No  Physical Activity: Insufficiently Active  . Days of Exercise per Week: 1 day  . Minutes of Exercise per Session: 10 min  Stress: Stress Concern Present  . Feeling of Stress : To some extent  Social Connections: Moderately Isolated  . Frequency of Communication with Friends and Family: Three times a week  . Frequency of Social Gatherings with Friends and Family: Once a week  . Attends Religious Services: Never  . Active Member of Clubs or Organizations: No  . Attends Occupational hygienist Meetings: Never  . Marital Status: Living with partner    History reviewed. No pertinent family history.

## 2020-06-18 ENCOUNTER — Telehealth: Payer: Self-pay | Admitting: *Deleted

## 2020-06-18 NOTE — Telephone Encounter (Signed)
Perfect, thank you

## 2020-06-18 NOTE — Telephone Encounter (Signed)
Called the patient and scheduled a new patient appt for 11/15 at 9 am with Dr Pricilla Holm. Patient given the address and phone number of the clinic. Patient also given the policy for mask and visitors

## 2020-06-21 NOTE — H&P (View-Only) (Signed)
GYNECOLOGIC ONCOLOGY NEW PATIENT CONSULTATION   Patient Name: [name]  Patient Age: 54 y.o. Date of Service: 06/24/20 Referring Provider: Dr. 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  I discussed with the patient that based on my exam today, there are not findings that are highly concerning for invasive carcinoma, but that is a risk in the setting of high-grade dysplasia once we perform surgery to resect the entirety of the lesion.  I think the lesion can be resected with adequate margins laterally and inferiorly however it is quite close to the clitoris and clitoral hood.  If no cancer present, my preference would be to spare the clitoris.  The patient has not been sexually active in 2 years but would prefer to keep her clitoris if this is possible.  I can plan to resect the lesion is close to this area as possible to allow for closure, and then I can use laser on the margin.  Ultimately, if an invasive cancer is diagnosed, depending on the size and need for reexcision for negative margins, the patient may ultimately be at risk for losing her clitoris.  She is understanding of this.  We will plan for wide local excision with possible CO2 laser ablation in late November or early December.  The procedure as well as its risks were discussed with the patient including but not limited to bleeding, need for blood transfusion, infection, dehiscence of the surgical wound, damage to surrounding structures, venous thromboembolism, and medical risks such as pneumonia or cardiac arrest.  The procedure will be scheduled as an outpatient procedure.  We will not plan to send frozen section unless there is something highly concerning for cancer at the time of exam under anesthesia.   Discussed perioperative expectations and postoperative recovery.  All the patient's questions were answered today.  A copy of this note was sent to the patient's referring provider.   55 minutes of total time was spent for this patient encounter, including preparation, face-to-face counseling with the patient and coordination of care, and documentation of the encounter.  Eugene Garnet, MD  Division of Gynecologic Oncology  Department of Obstetrics and Gynecology  [facility]  ___________________________________________  Chief Complaint: Chief Complaint  Patient presents with  . VIN III (vulvar intraepithelial neoplasia III)    History of Present Illness:  [name] is a 54 y.o. y.o. female who is seen in consultation at the request of Dr. Despina Hidden for an evaluation of high-grade vulvar dysplasia.  Patient reports starting to have vulvar pruritus back in March or April.  She thought it was a yeast infection.  At that time she saw her primary care provider and got nystatin.  She tried using this on her vulva without any relief.  She tried some other over-the-counter medications including psoriasis lotion and Tinactin.  Given continued symptoms and enlarging lesion, she scheduled a visit with her PCP given that she was due for 58-month follow-up.  She was then referred to OB/GYN.  She notes pain with any contact on her vulva.  She has intense pruritus intermittently and has been using Desitin for the last week without much relief.  She denies any associated bleeding or discharge.  When she saw Dr. Despina Hidden on 11/1, she underwent punch biopsy with pathology revealing  high-grade dysplasia.  Since that visit, she remembered a history of vulvar warts at age 51 or 77 with no recurrence after treatment.  She endorses a good appetite without nausea or emesis.  She reports normal bowel and bladder function.  She had hot flashes for several years in her early 29s, but denies  any in the last year.  She lives in [place] and had a long history of tobacco use, but quit 18 months ago.  She drinks approximately 2-3 ounces of liquor a few times a week.  Her surgical history is notable for a hysterectomy at the age of 29 for adenomyosis, ovaries were left in situ.  She also has undergone a cholecystectomy.   PAST MEDICAL HISTORY:  Past Medical History:  Diagnosis Date  . Arthritis   . Chronic back pain   . Cold   . Depression   . Fibromyalgia    dx yrs ago no tx  . GERD (gastroesophageal reflux disease)    occ  . Hypertension   . Hypertriglyceridemia   . Hypothyroidism   . VIN III (vulvar intraepithelial neoplasia III)      PAST SURGICAL HISTORY:  Past Surgical History:  Procedure Laterality Date  . ABDOMINAL HYSTERECTOMY    . BACK SURGERY     2008 FUSION, 2010 [facility] REMOVAL, 2012   . CARPAL TUNNEL RELEASE     BILAT 2007   . CERVICAL FUSION     2006  . CHOLECYSTECTOMY     2004  . HEMORROIDECTOMY     2004  . ULNAR TUNNEL RELEASE     LEFT ARM 2010     OB/GYN HISTORY:  OB History  Gravida Para Term Preterm AB Living  8 4       4   SAB TAB Ectopic Multiple Live Births               # Outcome Date GA Lbr Len/2nd Weight Sex Delivery Anes PTL Lv  8 Para           7 Para           6 Para           5 Para           4 Gravida      Vag-Spont     3 Gravida      Vag-Spont     2 Gravida      Vag-Spont     1 Gravida      Vag-Spont       No LMP recorded. Patient has had a hysterectomy.  Age at menarche: 52 Age at menopause: Early 19s Hx of HRT: Denies Hx of STDs: HPV Last pap: 1-2 years ago History of abnormal pap smears: Denies  MEDICATIONS: Outpatient Encounter Medications as of 06/24/2020  Medication Sig  . cimetidine (TAGAMET) 200 MG tablet Take 200 mg by mouth 2 (two) times daily.  . Magnesium 200 MG TABS Take by mouth in the morning and at bedtime.  06/26/2020 MILK THISTLE PO Take by mouth.  . Multiple Vitamin (MULTIVITAMIN) capsule Take 1 capsule  by mouth daily.  . Omega-3 Fatty Acids (FISH OIL) 1200 MG CAPS Take 2,400 mg by mouth in the morning and at bedtime.  . Turmeric (QC TUMERIC COMPLEX PO) Take by mouth in the morning and at bedtime.  . EUTHYROX 75 MCG tablet Take 75 mcg by mouth daily.  Marland Kitchen ibuprofen (ADVIL) 800 MG tablet Take 1 tablet (800 mg total) by mouth every  8 (eight) hours as needed for moderate pain. For AFTER surgery only  . metoprolol tartrate (LOPRESSOR) 25 MG tablet Take 25 mg by mouth 2 (two) times daily.  . naproxen (NAPROSYN) 500 MG tablet Take by mouth.  . nystatin cream (MYCOSTATIN) Apply 1 application topically 2 (two) times daily.  Marland Kitchen oxyCODONE (OXY IR/ROXICODONE) 5 MG immediate release tablet Take 1 tablet (5 mg total) by mouth every 4 (four) hours as needed for severe pain. For AFTER surgery only, do not take and drive  . senna-docusate (SENOKOT-S) 8.6-50 MG tablet Take 2 tablets by mouth at bedtime. For AFTER surgery, do not take if having diarrhea  . sodium chloride (OCEAN) 0.65 % nasal spray Place 2 sprays into the nose daily as needed. For nasal congestion   No facility-administered encounter medications on file as of 06/24/2020.    ALLERGIES:  Allergies  Allergen Reactions  . Hydrocodone Other (See Comments)    hyper  . Codeine     Hyper, anxious, can't sleep  . Ibuprofen Other (See Comments)    GI      FAMILY HISTORY:  Family History  Problem Relation Age of Onset  . Ovarian cancer Mother   . Skin cancer Mother   . Bladder Cancer Father   . Leukemia Sister   . Breast cancer Neg Hx   . Colon cancer Neg Hx      SOCIAL HISTORY:    Social Connections: Moderately Isolated  . Frequency of Communication with Friends and Family: Three times a week  . Frequency of Social Gatherings with Friends and Family: Once a week  . Attends Religious Services: Never  . Active Member of Clubs or Organizations: No  . Attends Banker Meetings: Never  . Marital Status: Living with partner     REVIEW OF SYSTEMS:  + Fatigue, pruritus, rash, anxiety. Denies appetite changes, fevers, chills, unexplained weight changes. Denies hearing loss, neck lumps or masses, mouth sores, ringing in ears or voice changes. Denies cough or wheezing.  Denies shortness of breath. Denies chest pain or palpitations. Denies leg swelling. Denies abdominal distention, pain, blood in stools, constipation, diarrhea, nausea, vomiting, or early satiety. Denies pain with intercourse, dysuria, frequency, hematuria or incontinence. Denies hot flashes, pelvic pain, vaginal bleeding or vaginal discharge.   Denies joint pain, back pain or muscle pain/cramps. Denies wounds. Denies dizziness, headaches, numbness or seizures. Denies swollen lymph nodes or glands, denies easy bruising or bleeding. Denies depression, confusion, or decreased concentration.  Physical Exam:  Vital Signs for this encounter:  Blood pressure (!) 150/93, pulse 70, temperature 98.7 F (37.1 C), temperature source Oral, resp. rate 18, weight 194 lb (88 kg), SpO2 100 %. Body mass index is 31.31 kg/m. General: Alert, oriented, no acute distress.  HEENT: Normocephalic, atraumatic. Sclera anicteric.  Chest: Clear to auscultation bilaterally. No wheezes, rhonchi, or rales. Cardiovascular: Regular rate and rhythm, no murmurs, rubs, or gallops.  Abdomen: Normoactive bowel sounds. Soft, nondistended, nontender to palpation. No masses or hepatosplenomegaly appreciated. No palpable fluid wave.  Extremities: Grossly normal range of motion. Warm, well perfused. No edema bilaterally.  Skin: No rashes or lesions.  Lymphatics: No cervical, supraclavicular, or inguinal adenopathy.  GU:  External female genitalia notable for an approximately 4 x 4 centimeter area on the left upper vulva that encroaches the clitoris and clitoral hood and the labia minora.  There is Desitin over much of this area but the area without Desitin is somewhat hyperpigmented and  raw appearing.  Exam  consistent with high-grade dysplasia with no areas obviously concerning for invasive cancer.  LABORATORY AND RADIOLOGIC DATA:  Outside medical records were reviewed to synthesize the above history, along with the history and physical obtained during the visit.   11/1 - vulvar biopsy: VIN3 

## 2020-06-21 NOTE — Progress Notes (Signed)
GYNECOLOGIC ONCOLOGY NEW PATIENT CONSULTATION   Patient Name: [name]  Patient Age: 54 y.o. Date of Service: 06/24/20 Referring Provider: Dr. 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  I discussed with the patient that based on my exam today, there are not findings that are highly concerning for invasive carcinoma, but that is a risk in the setting of high-grade dysplasia once we perform surgery to resect the entirety of the lesion.  I think the lesion can be resected with adequate margins laterally and inferiorly however it is quite close to the clitoris and clitoral hood.  If no cancer present, my preference would be to spare the clitoris.  The patient has not been sexually active in 2 years but would prefer to keep her clitoris if this is possible.  I can plan to resect the lesion is close to this area as possible to allow for closure, and then I can use laser on the margin.  Ultimately, if an invasive cancer is diagnosed, depending on the size and need for reexcision for negative margins, the patient may ultimately be at risk for losing her clitoris.  She is understanding of this.  We will plan for wide local excision with possible CO2 laser ablation in late November or early December.  The procedure as well as its risks were discussed with the patient including but not limited to bleeding, need for blood transfusion, infection, dehiscence of the surgical wound, damage to surrounding structures, venous thromboembolism, and medical risks such as pneumonia or cardiac arrest.  The procedure will be scheduled as an outpatient procedure.  We will not plan to send frozen section unless there is something highly concerning for cancer at the time of exam under anesthesia.   Discussed perioperative expectations and postoperative recovery.  All the patient's questions were answered today.  A copy of this note was sent to the patient's referring provider.   55 minutes of total time was spent for this patient encounter, including preparation, face-to-face counseling with the patient and coordination of care, and documentation of the encounter.  Eugene Garnet, MD  Division of Gynecologic Oncology  Department of Obstetrics and Gynecology  [facility]  ___________________________________________  Chief Complaint: Chief Complaint  Patient presents with  . VIN III (vulvar intraepithelial neoplasia III)    History of Present Illness:  [name] is a 54 y.o. y.o. female who is seen in consultation at the request of Dr. Despina Hidden for an evaluation of high-grade vulvar dysplasia.  Patient reports starting to have vulvar pruritus back in March or April.  She thought it was a yeast infection.  At that time she saw her primary care provider and got nystatin.  She tried using this on her vulva without any relief.  She tried some other over-the-counter medications including psoriasis lotion and Tinactin.  Given continued symptoms and enlarging lesion, she scheduled a visit with her PCP given that she was due for 58-month follow-up.  She was then referred to OB/GYN.  She notes pain with any contact on her vulva.  She has intense pruritus intermittently and has been using Desitin for the last week without much relief.  She denies any associated bleeding or discharge.  When she saw Dr. Despina Hidden on 11/1, she underwent punch biopsy with pathology revealing  high-grade dysplasia.  Since that visit, she remembered a history of vulvar warts at age 51 or 77 with no recurrence after treatment.  She endorses a good appetite without nausea or emesis.  She reports normal bowel and bladder function.  She had hot flashes for several years in her early 29s, but denies  any in the last year.  She lives in [place] and had a long history of tobacco use, but quit 18 months ago.  She drinks approximately 2-3 ounces of liquor a few times a week.  Her surgical history is notable for a hysterectomy at the age of 29 for adenomyosis, ovaries were left in situ.  She also has undergone a cholecystectomy.   PAST MEDICAL HISTORY:  Past Medical History:  Diagnosis Date  . Arthritis   . Chronic back pain   . Cold   . Depression   . Fibromyalgia    dx yrs ago no tx  . GERD (gastroesophageal reflux disease)    occ  . Hypertension   . Hypertriglyceridemia   . Hypothyroidism   . VIN III (vulvar intraepithelial neoplasia III)      PAST SURGICAL HISTORY:  Past Surgical History:  Procedure Laterality Date  . ABDOMINAL HYSTERECTOMY    . BACK SURGERY     2008 FUSION, 2010 [facility] REMOVAL, 2012   . CARPAL TUNNEL RELEASE     BILAT 2007   . CERVICAL FUSION     2006  . CHOLECYSTECTOMY     2004  . HEMORROIDECTOMY     2004  . ULNAR TUNNEL RELEASE     LEFT ARM 2010     OB/GYN HISTORY:  OB History  Gravida Para Term Preterm AB Living  8 4       4   SAB TAB Ectopic Multiple Live Births               # Outcome Date GA Lbr Len/2nd Weight Sex Delivery Anes PTL Lv  8 Para           7 Para           6 Para           5 Para           4 Gravida      Vag-Spont     3 Gravida      Vag-Spont     2 Gravida      Vag-Spont     1 Gravida      Vag-Spont       No LMP recorded. Patient has had a hysterectomy.  Age at menarche: 52 Age at menopause: Early 19s Hx of HRT: Denies Hx of STDs: HPV Last pap: 1-2 years ago History of abnormal pap smears: Denies  MEDICATIONS: Outpatient Encounter Medications as of 06/24/2020  Medication Sig  . cimetidine (TAGAMET) 200 MG tablet Take 200 mg by mouth 2 (two) times daily.  . Magnesium 200 MG TABS Take by mouth in the morning and at bedtime.  06/26/2020 MILK THISTLE PO Take by mouth.  . Multiple Vitamin (MULTIVITAMIN) capsule Take 1 capsule  by mouth daily.  . Omega-3 Fatty Acids (FISH OIL) 1200 MG CAPS Take 2,400 mg by mouth in the morning and at bedtime.  . Turmeric (QC TUMERIC COMPLEX PO) Take by mouth in the morning and at bedtime.  . EUTHYROX 75 MCG tablet Take 75 mcg by mouth daily.  Marland Kitchen ibuprofen (ADVIL) 800 MG tablet Take 1 tablet (800 mg total) by mouth every  8 (eight) hours as needed for moderate pain. For AFTER surgery only  . metoprolol tartrate (LOPRESSOR) 25 MG tablet Take 25 mg by mouth 2 (two) times daily.  . naproxen (NAPROSYN) 500 MG tablet Take by mouth.  . nystatin cream (MYCOSTATIN) Apply 1 application topically 2 (two) times daily.  Marland Kitchen oxyCODONE (OXY IR/ROXICODONE) 5 MG immediate release tablet Take 1 tablet (5 mg total) by mouth every 4 (four) hours as needed for severe pain. For AFTER surgery only, do not take and drive  . senna-docusate (SENOKOT-S) 8.6-50 MG tablet Take 2 tablets by mouth at bedtime. For AFTER surgery, do not take if having diarrhea  . sodium chloride (OCEAN) 0.65 % nasal spray Place 2 sprays into the nose daily as needed. For nasal congestion   No facility-administered encounter medications on file as of 06/24/2020.    ALLERGIES:  Allergies  Allergen Reactions  . Hydrocodone Other (See Comments)    hyper  . Codeine     Hyper, anxious, can't sleep  . Ibuprofen Other (See Comments)    GI      FAMILY HISTORY:  Family History  Problem Relation Age of Onset  . Ovarian cancer Mother   . Skin cancer Mother   . Bladder Cancer Father   . Leukemia Sister   . Breast cancer Neg Hx   . Colon cancer Neg Hx      SOCIAL HISTORY:    Social Connections: Moderately Isolated  . Frequency of Communication with Friends and Family: Three times a week  . Frequency of Social Gatherings with Friends and Family: Once a week  . Attends Religious Services: Never  . Active Member of Clubs or Organizations: No  . Attends Banker Meetings: Never  . Marital Status: Living with partner     REVIEW OF SYSTEMS:  + Fatigue, pruritus, rash, anxiety. Denies appetite changes, fevers, chills, unexplained weight changes. Denies hearing loss, neck lumps or masses, mouth sores, ringing in ears or voice changes. Denies cough or wheezing.  Denies shortness of breath. Denies chest pain or palpitations. Denies leg swelling. Denies abdominal distention, pain, blood in stools, constipation, diarrhea, nausea, vomiting, or early satiety. Denies pain with intercourse, dysuria, frequency, hematuria or incontinence. Denies hot flashes, pelvic pain, vaginal bleeding or vaginal discharge.   Denies joint pain, back pain or muscle pain/cramps. Denies wounds. Denies dizziness, headaches, numbness or seizures. Denies swollen lymph nodes or glands, denies easy bruising or bleeding. Denies depression, confusion, or decreased concentration.  Physical Exam:  Vital Signs for this encounter:  Blood pressure (!) 150/93, pulse 70, temperature 98.7 F (37.1 C), temperature source Oral, resp. rate 18, weight 194 lb (88 kg), SpO2 100 %. Body mass index is 31.31 kg/m. General: Alert, oriented, no acute distress.  HEENT: Normocephalic, atraumatic. Sclera anicteric.  Chest: Clear to auscultation bilaterally. No wheezes, rhonchi, or rales. Cardiovascular: Regular rate and rhythm, no murmurs, rubs, or gallops.  Abdomen: Normoactive bowel sounds. Soft, nondistended, nontender to palpation. No masses or hepatosplenomegaly appreciated. No palpable fluid wave.  Extremities: Grossly normal range of motion. Warm, well perfused. No edema bilaterally.  Skin: No rashes or lesions.  Lymphatics: No cervical, supraclavicular, or inguinal adenopathy.  GU:  External female genitalia notable for an approximately 4 x 4 centimeter area on the left upper vulva that encroaches the clitoris and clitoral hood and the labia minora.  There is Desitin over much of this area but the area without Desitin is somewhat hyperpigmented and  raw appearing.  Exam  consistent with high-grade dysplasia with no areas obviously concerning for invasive cancer.  LABORATORY AND RADIOLOGIC DATA:  Outside medical records were reviewed to synthesize the above history, along with the history and physical obtained during the visit.   11/1 - vulvar biopsy: VIN3

## 2020-06-24 ENCOUNTER — Inpatient Hospital Stay: Payer: Medicaid Other | Attending: Gynecologic Oncology | Admitting: Gynecologic Oncology

## 2020-06-24 ENCOUNTER — Encounter: Payer: Self-pay | Admitting: Gynecologic Oncology

## 2020-06-24 ENCOUNTER — Other Ambulatory Visit: Payer: Self-pay

## 2020-06-24 VITALS — BP 150/93 | HR 70 | Temp 98.7°F | Resp 18 | Wt 194.0 lb

## 2020-06-24 DIAGNOSIS — K219 Gastro-esophageal reflux disease without esophagitis: Secondary | ICD-10-CM | POA: Diagnosis not present

## 2020-06-24 DIAGNOSIS — M1990 Unspecified osteoarthritis, unspecified site: Secondary | ICD-10-CM | POA: Insufficient documentation

## 2020-06-24 DIAGNOSIS — Z90710 Acquired absence of both cervix and uterus: Secondary | ICD-10-CM | POA: Diagnosis not present

## 2020-06-24 DIAGNOSIS — Z79899 Other long term (current) drug therapy: Secondary | ICD-10-CM | POA: Insufficient documentation

## 2020-06-24 DIAGNOSIS — M797 Fibromyalgia: Secondary | ICD-10-CM | POA: Diagnosis not present

## 2020-06-24 DIAGNOSIS — I10 Essential (primary) hypertension: Secondary | ICD-10-CM | POA: Insufficient documentation

## 2020-06-24 DIAGNOSIS — Z791 Long term (current) use of non-steroidal anti-inflammatories (NSAID): Secondary | ICD-10-CM | POA: Diagnosis not present

## 2020-06-24 DIAGNOSIS — E781 Pure hyperglyceridemia: Secondary | ICD-10-CM | POA: Diagnosis not present

## 2020-06-24 DIAGNOSIS — N94819 Vulvodynia, unspecified: Secondary | ICD-10-CM | POA: Diagnosis not present

## 2020-06-24 DIAGNOSIS — D071 Carcinoma in situ of vulva: Secondary | ICD-10-CM | POA: Insufficient documentation

## 2020-06-24 DIAGNOSIS — Z87891 Personal history of nicotine dependence: Secondary | ICD-10-CM | POA: Insufficient documentation

## 2020-06-24 DIAGNOSIS — E039 Hypothyroidism, unspecified: Secondary | ICD-10-CM | POA: Diagnosis not present

## 2020-06-24 MED ORDER — SENNOSIDES-DOCUSATE SODIUM 8.6-50 MG PO TABS: 2.0000 | ORAL_TABLET | Freq: Every day | ORAL | 1 refills | Status: DC

## 2020-06-24 MED ORDER — OXYCODONE HCL 5 MG PO TABS: 5.0000 mg | ORAL_TABLET | ORAL | 0 refills | Status: DC | PRN

## 2020-06-24 MED ORDER — IBUPROFEN 800 MG PO TABS: 800.0000 mg | ORAL_TABLET | Freq: Three times a day (TID) | ORAL | 0 refills | Status: DC | PRN

## 2020-06-24 NOTE — Patient Instructions (Signed)
Preparing for your Surgery  Plan for surgery on July 09, 2020 with Dr. Eugene Garnet at [facility]. You will be scheduled for a wide local excision of the vulva and laser application of the vulva.   Pre-operative Testing -You will receive a phone call from presurgical testing at [facility] to arrange for a pre-operative appointment over the phone, lab appointment if needed, and COVID test. The COVID test normally happens 3 days prior to the surgery and they ask that you self quarantine after the test up until surgery to decrease chance of exposure.  -Bring your insurance card, copy of an advanced directive if applicable, medication list  -You should not be taking blood thinners or aspirin at least ten days prior to surgery unless instructed by your surgeon.  -Do not take supplements such as fish oil (omega 3), red yeast rice, turmeric before your surgery.   Day Before Surgery at Home -You will be advised you can have clear liquids up until 3 hours before your surgery.    Your role in recovery Your role is to become active as soon as directed by your doctor, while still giving yourself time to heal.  Rest when you feel tired. You will be asked to do the following in order to speed your recovery:  - Cough and breathe deeply. This helps to clear and expand your lungs and can prevent pneumonia after surgery.  - STAY ACTIVE WHEN YOU GET HOME. Do mild physical activity. Walking or moving your legs help your circulation and body functions return to normal. Do not try to get up or walk alone the first time after surgery.   -If you develop swelling on one leg or the other, pain in the back of your leg, redness/warmth in one of your legs, please call the office or go to the Emergency Room to have a doppler to rule out a blood clot. For shortness of breath, chest pain-seek care in the Emergency Room as soon as possible. - Actively manage your pain.  Managing your pain lets you move in comfort. We will ask you to rate your pain on a scale of zero to 10. It is your responsibility to tell your doctor or nurse where and how much you hurt so your pain can be treated.  Special Considerations -If you are diabetic, you may be placed on insulin after surgery to have closer control over your blood sugars to promote healing and recovery.  This does not mean that you will be discharged on insulin.  If applicable, your oral antidiabetics will be resumed when you are tolerating a solid diet.  -Your final pathology results from surgery should be available around one week after surgery and the results will be relayed to you when available.  -FMLA forms can be faxed to [phone] and please allow 5-7 business days for completion.  Pain Management After Surgery -You have been prescribed your pain medication and bowel regimen medications before surgery so that you can have these available when you are discharged from the hospital. The pain medication is for use ONLY AFTER surgery and a new prescription will not be given.   -Make sure that you have Tylenol and Ibuprofen at home to use on a regular basis after surgery for pain control. We recommend alternating the medications every hour to six hours since they work differently and are processed in the body differently for pain relief.  -Review the attached handout on narcotic use and  their risks and side effects.   Bowel Regimen -You have been prescribed Sennakot-S to take nightly to prevent constipation especially if you are taking the narcotic pain medication intermittently.  It is important to prevent constipation and drink adequate amounts of liquids. You can stop taking this medication when you are not taking pain medication and you are back on your normal bowel routine.  Risks of Surgery Risks of surgery are low but include bleeding, infection, damage to surrounding structures, re-operation, blood clots,  and very rarely death.  AFTER SURGERY INSTRUCTIONS  Return to work: 2-4 weeks if applicable  Activity: 1. Be up and out of the bed during the day.  Take a nap if needed.  You may walk up steps but be careful and use the hand rail.  Stair climbing will tire you more than you think, you may need to stop part way and rest.   2. No lifting or straining for 4 weeks over 10 pounds. No pushing, pulling, straining for 4 weeks.  3. No driving for minimum of 48 hours after surgery.  Do not drive if you are taking narcotic pain medicine and make sure that your reaction time has returned.   4. You can shower as soon as the next day after surgery. Shower daily.  Use your regular soap and water (not directly on the incision) and pat your incision(s) dry afterwards; don't rub.  No tub baths or submerging your body in water until cleared by your surgeon. If you have the soap that was given to you by pre-surgical testing that was used before surgery, you do not need to use it afterwards because this can irritate your incisions.   5. No sexual activity and nothing in the vagina for 4 weeks.  6. You may experience a small amount of clear drainage from your incision, which is normal.  If the drainage persists, increases, or changes color please call the office.  8. Take Tylenol or ibuprofen first for pain and only use narcotic pain medication for severe pain not relieved by the Tylenol or Ibuprofen.  Monitor your Tylenol intake to a max of 4,000 mg in a 24 hour period. You can alternate these medications after surgery.  Diet: 1. Low sodium Heart Healthy Diet is recommended but you are cleared to resume your normal (before surgery) diet after your procedure.  2. It is safe to use a laxative, such as Miralax or Colace, if you have difficulty moving your bowels. You have been prescribed Sennakot at bedtime every evening to keep bowel movements regular and to prevent constipation.    Wound Care: 1. Keep clean and  dry.  Shower daily.  Reasons to call the Doctor:  Fever - Oral temperature greater than 100.4 degrees Fahrenheit  Foul-smelling vaginal discharge  Difficulty urinating  Nausea and vomiting  Increased pain at the site of the incision that is unrelieved with pain medicine.  Difficulty breathing with or without chest pain  New calf pain especially if only on one side  Sudden, continuing increased vaginal bleeding with or without clots.   Contacts: For questions or concerns you should contact:  Dr. Eugene Garnet at [phone]  Warner Mccreedy, NP at [phone]  After Hours: call [phone] and have the GYN Oncologist paged/contacted (after 5 pm or on the weekends)

## 2020-06-25 ENCOUNTER — Other Ambulatory Visit: Payer: Self-pay | Admitting: Gynecologic Oncology

## 2020-06-25 DIAGNOSIS — D071 Carcinoma in situ of vulva: Secondary | ICD-10-CM

## 2020-06-28 ENCOUNTER — Encounter (HOSPITAL_BASED_OUTPATIENT_CLINIC_OR_DEPARTMENT_OTHER): Payer: Self-pay | Admitting: Gynecologic Oncology

## 2020-06-28 ENCOUNTER — Other Ambulatory Visit: Payer: Self-pay

## 2020-06-28 NOTE — Progress Notes (Signed)
Spoke w/ via phone for pre-op interview--- pt Lab needs dos----  no             Lab results------ pt getting CBC, BMP, EKG on 07-08-2020 @1330  COVID test ------  07-05-2020 @ 1420 Arrive at ------- 1215 NPO after MN NO Solid Food.  Clear liquids from MN until--- 1115 Medications to take morning of surgery ----- Tagament, Lopressor, Euthyrox Diabetic medication ----- n/a Patient Special Instructions ----- n/a Pre-Op special Istructions ----- n/a Patient verbalized understanding of instructions that were given at this phone interview. Patient denies shortness of breath, chest pain, fever, cough at this phone interview.

## 2020-07-02 ENCOUNTER — Telehealth: Payer: Self-pay

## 2020-07-02 NOTE — Telephone Encounter (Signed)
Told Ms Requejo that Dr.Tucker will discuss the lesion of concern the morning of surgery 07-09-20 and discuss surgical plan at that time. Pt verbalized understanding.

## 2020-07-03 NOTE — Progress Notes (Signed)
Spoke with patient by phone aware surgery time changed to 945 am arrive 745 am 07-09-2020 clear liquids from midnight until 645 am then npo

## 2020-07-05 ENCOUNTER — Other Ambulatory Visit (HOSPITAL_COMMUNITY)
Admission: RE | Admit: 2020-07-05 | Discharge: 2020-07-05 | Disposition: A | Discharge status: Home / Self Care | Payer: Medicaid Other | Source: Ambulatory Visit | Attending: Gynecologic Oncology | Admitting: Gynecologic Oncology

## 2020-07-05 DIAGNOSIS — Z20822 Contact with and (suspected) exposure to covid-19: Secondary | ICD-10-CM | POA: Diagnosis not present

## 2020-07-05 DIAGNOSIS — Z01812 Encounter for preprocedural laboratory examination: Secondary | ICD-10-CM | POA: Diagnosis present

## 2020-07-05 LAB — SARS CORONAVIRUS 2 (TAT 6-24 HRS): SARS Coronavirus 2: NEGATIVE

## 2020-07-08 ENCOUNTER — Telehealth: Payer: Self-pay

## 2020-07-08 ENCOUNTER — Encounter (HOSPITAL_COMMUNITY)
Admission: RE | Admit: 2020-07-08 | Discharge: 2020-07-08 | Disposition: A | Discharge status: Home / Self Care | Payer: Medicaid Other | Source: Ambulatory Visit | Attending: Gynecologic Oncology | Admitting: Gynecologic Oncology

## 2020-07-08 ENCOUNTER — Other Ambulatory Visit: Payer: Self-pay

## 2020-07-08 DIAGNOSIS — Z01818 Encounter for other preprocedural examination: Secondary | ICD-10-CM | POA: Diagnosis present

## 2020-07-08 LAB — BASIC METABOLIC PANEL
Anion gap: 12 (ref 5–15)
BUN: 9 mg/dL (ref 6–20)
CO2: 21 mmol/L — ABNORMAL LOW (ref 22–32)
Calcium: 8.9 mg/dL (ref 8.9–10.3)
Chloride: 105 mmol/L (ref 98–111)
Creatinine, Ser: 0.82 mg/dL (ref 0.44–1.00)
GFR, Estimated: >60 mL/min (ref >60)
Glucose, Bld: 101 mg/dL — ABNORMAL HIGH (ref 70–99)
Potassium: 4.4 mmol/L (ref 3.5–5.1)
Sodium: 138 mmol/L (ref 135–145)

## 2020-07-08 LAB — CBC
HCT: 41.2 % (ref 36.0–46.0)
Hemoglobin: 13.8 g/dL (ref 12.0–15.0)
MCH: 33.7 pg (ref 26.0–34.0)
MCHC: 33.5 g/dL (ref 30.0–36.0)
MCV: 100.7 fL — ABNORMAL HIGH (ref 80.0–100.0)
Platelets: 202 10*3/uL (ref 150–400)
RBC: 4.09 MIL/uL (ref 3.87–5.11)
RDW: 13.7 % (ref 11.5–15.5)
WBC: 7.3 10*3/uL (ref 4.0–10.5)
nRBC: 0 % (ref 0.0–0.2)

## 2020-07-08 NOTE — Anesthesia Preprocedure Evaluation (Addendum)
Anesthesia Evaluation  Patient identified by MRN, date of birth, ID band Patient awake    Reviewed: Allergy & Precautions, NPO status , Patient's Chart, lab work & pertinent test results  Airway Mallampati: II  TM Distance: >3 FB Neck ROM: Full    Dental no notable dental hx. (+) Teeth Intact, Dental Advisory Given   Pulmonary former smoker,    Pulmonary exam normal breath sounds clear to auscultation       Cardiovascular hypertension, Pt. on medications Normal cardiovascular exam Rhythm:Regular Rate:Normal     Neuro/Psych PSYCHIATRIC DISORDERS Depression  Neuromuscular disease    GI/Hepatic Neg liver ROS, hiatal hernia, GERD  ,  Endo/Other  Hypothyroidism   Renal/GU negative Renal ROS  Female GU complaint Vaginal dysplasia    Musculoskeletal  (+) Arthritis , Fibromyalgia -Chronic back pain   Abdominal   Peds  Hematology negative hematology ROS (+) Hgb 13.0   Anesthesia Other Findings All to Hydrocodone codeine ibuprofen  Reproductive/Obstetrics                            Anesthesia Physical Anesthesia Plan  ASA: III  Anesthesia Plan: General   Post-op Pain Management:    Induction: Intravenous  PONV Risk Score and Plan: 4 or greater and Treatment may vary due to age or medical condition, Midazolam, Ondansetron and Dexamethasone  Airway Management Planned: Oral ETT  Additional Equipment: None  Intra-op Plan:   Post-operative Plan: Extubation in OR  Informed Consent: I have reviewed the patients History and Physical, chart, labs and discussed the procedure including the risks, benefits and alternatives for the proposed anesthesia with the patient or authorized representative who has indicated his/her understanding and acceptance.     Dental advisory given  Plan Discussed with: CRNA and Anesthesiologist  Anesthesia Plan Comments:        Anesthesia Quick  Evaluation

## 2020-07-08 NOTE — Telephone Encounter (Signed)
[name] states that she understands her pre-op instructions and has no questions or concerns at this time.

## 2020-07-09 ENCOUNTER — Ambulatory Visit (HOSPITAL_BASED_OUTPATIENT_CLINIC_OR_DEPARTMENT_OTHER): Payer: Medicaid Other | Admitting: Anesthesiology

## 2020-07-09 ENCOUNTER — Encounter (HOSPITAL_BASED_OUTPATIENT_CLINIC_OR_DEPARTMENT_OTHER)
Admission: RE | Disposition: A | Discharge status: Home / Self Care | Payer: Self-pay | Source: Home / Self Care | Attending: Gynecologic Oncology

## 2020-07-09 ENCOUNTER — Ambulatory Visit (HOSPITAL_BASED_OUTPATIENT_CLINIC_OR_DEPARTMENT_OTHER)
Admission: RE | Admit: 2020-07-09 | Discharge: 2020-07-09 | Disposition: A | Discharge status: Home / Self Care | Payer: Medicaid Other | Attending: Gynecologic Oncology | Admitting: Gynecologic Oncology

## 2020-07-09 ENCOUNTER — Encounter (HOSPITAL_BASED_OUTPATIENT_CLINIC_OR_DEPARTMENT_OTHER): Payer: Self-pay | Admitting: Gynecologic Oncology

## 2020-07-09 DIAGNOSIS — L720 Epidermal cyst: Secondary | ICD-10-CM | POA: Insufficient documentation

## 2020-07-09 DIAGNOSIS — Z79890 Hormone replacement therapy (postmenopausal): Secondary | ICD-10-CM | POA: Insufficient documentation

## 2020-07-09 DIAGNOSIS — N903 Dysplasia of vulva, unspecified: Secondary | ICD-10-CM | POA: Diagnosis present

## 2020-07-09 DIAGNOSIS — D071 Carcinoma in situ of vulva: Secondary | ICD-10-CM | POA: Insufficient documentation

## 2020-07-09 DIAGNOSIS — L729 Follicular cyst of the skin and subcutaneous tissue, unspecified: Secondary | ICD-10-CM | POA: Diagnosis not present

## 2020-07-09 DIAGNOSIS — R2241 Localized swelling, mass and lump, right lower limb: Secondary | ICD-10-CM

## 2020-07-09 DIAGNOSIS — Z885 Allergy status to narcotic agent status: Secondary | ICD-10-CM | POA: Diagnosis not present

## 2020-07-09 DIAGNOSIS — Z79899 Other long term (current) drug therapy: Secondary | ICD-10-CM | POA: Insufficient documentation

## 2020-07-09 DIAGNOSIS — Z886 Allergy status to analgesic agent status: Secondary | ICD-10-CM | POA: Diagnosis not present

## 2020-07-09 DIAGNOSIS — Z791 Long term (current) use of non-steroidal anti-inflammatories (NSAID): Secondary | ICD-10-CM | POA: Diagnosis not present

## 2020-07-09 HISTORY — DX: Shortness of breath: R06.02

## 2020-07-09 HISTORY — DX: Mixed hyperlipidemia: E78.2

## 2020-07-09 HISTORY — PX: VULVECTOMY: SHX1086

## 2020-07-09 HISTORY — DX: Personal history of other diseases of the digestive system: Z87.19

## 2020-07-09 HISTORY — DX: Presence of spectacles and contact lenses: Z97.3

## 2020-07-09 HISTORY — DX: Diaphragmatic hernia without obstruction or gangrene: K44.9

## 2020-07-09 SURGERY — WIDE EXCISION VULVECTOMY
Anesthesia: General | Site: Vagina

## 2020-07-09 MED ORDER — KETOROLAC TROMETHAMINE 30 MG/ML IJ SOLN: 30.0000 mg | Freq: Once | INTRAMUSCULAR | Status: DC | PRN

## 2020-07-09 MED ORDER — ACETAMINOPHEN 500 MG PO TABS
1000.0000 mg | ORAL_TABLET | ORAL | Status: AC
  Administered 2020-07-09: 1000 mg via ORAL

## 2020-07-09 MED ORDER — OXYCODONE HCL 5 MG/5ML PO SOLN: 5.0000 mg | Freq: Once | ORAL | Status: DC | PRN

## 2020-07-09 MED ORDER — EPHEDRINE SULFATE-NACL 50-0.9 MG/10ML-% IV SOSY
PREFILLED_SYRINGE | INTRAVENOUS | Status: DC | PRN
  Administered 2020-07-09 (×2): 10 mg via INTRAVENOUS

## 2020-07-09 MED ORDER — MIDAZOLAM HCL 2 MG/2ML IJ SOLN
INTRAMUSCULAR | Status: AC
  Filled 2020-07-09: qty 2

## 2020-07-09 MED ORDER — LIDOCAINE 2% (20 MG/ML) 5 ML SYRINGE
INTRAMUSCULAR | Status: DC | PRN
  Administered 2020-07-09: 100 mg via INTRAVENOUS

## 2020-07-09 MED ORDER — FENTANYL CITRATE (PF) 100 MCG/2ML IJ SOLN
INTRAMUSCULAR | Status: DC | PRN
  Administered 2020-07-09 (×2): 25 ug via INTRAVENOUS
  Administered 2020-07-09: 50 ug via INTRAVENOUS

## 2020-07-09 MED ORDER — OXYCODONE HCL 5 MG PO TABS: 5.0000 mg | ORAL_TABLET | Freq: Once | ORAL | Status: DC | PRN

## 2020-07-09 MED ORDER — HYDROMORPHONE HCL 1 MG/ML IJ SOLN: 0.2500 mg | INTRAMUSCULAR | Status: DC | PRN

## 2020-07-09 MED ORDER — GABAPENTIN 300 MG PO CAPS
ORAL_CAPSULE | ORAL | Status: AC
  Filled 2020-07-09: qty 1

## 2020-07-09 MED ORDER — DEXAMETHASONE SODIUM PHOSPHATE 10 MG/ML IJ SOLN
INTRAMUSCULAR | Status: AC
  Filled 2020-07-09: qty 1

## 2020-07-09 MED ORDER — DEXAMETHASONE SODIUM PHOSPHATE 10 MG/ML IJ SOLN
INTRAMUSCULAR | Status: DC | PRN
  Administered 2020-07-09 (×2): 5 mg via INTRAVENOUS

## 2020-07-09 MED ORDER — LACTATED RINGERS IV SOLN: INTRAVENOUS | Status: DC

## 2020-07-09 MED ORDER — PROPOFOL 10 MG/ML IV BOLUS
INTRAVENOUS | Status: DC | PRN
  Administered 2020-07-09: 160 mg via INTRAVENOUS

## 2020-07-09 MED ORDER — EPHEDRINE 5 MG/ML INJ
INTRAVENOUS | Status: AC
  Filled 2020-07-09: qty 10

## 2020-07-09 MED ORDER — LIDOCAINE HCL (PF) 2 % IJ SOLN
INTRAMUSCULAR | Status: AC
  Filled 2020-07-09: qty 5

## 2020-07-09 MED ORDER — DEXAMETHASONE SODIUM PHOSPHATE 10 MG/ML IJ SOLN: 4.0000 mg | INTRAMUSCULAR | Status: DC

## 2020-07-09 MED ORDER — SCOPOLAMINE 1 MG/3DAYS TD PT72
MEDICATED_PATCH | TRANSDERMAL | Status: AC
  Filled 2020-07-09: qty 1

## 2020-07-09 MED ORDER — CELECOXIB 200 MG PO CAPS
ORAL_CAPSULE | ORAL | Status: AC
  Filled 2020-07-09: qty 2

## 2020-07-09 MED ORDER — CELECOXIB 200 MG PO CAPS
400.0000 mg | ORAL_CAPSULE | ORAL | Status: AC
  Administered 2020-07-09: 400 mg via ORAL

## 2020-07-09 MED ORDER — ACETAMINOPHEN 500 MG PO TABS
ORAL_TABLET | ORAL | Status: AC
  Filled 2020-07-09: qty 2

## 2020-07-09 MED ORDER — ONDANSETRON HCL 4 MG/2ML IJ SOLN
INTRAMUSCULAR | Status: AC
  Filled 2020-07-09: qty 2

## 2020-07-09 MED ORDER — GABAPENTIN 300 MG PO CAPS
300.0000 mg | ORAL_CAPSULE | ORAL | Status: AC
  Administered 2020-07-09: 300 mg via ORAL

## 2020-07-09 MED ORDER — MIDAZOLAM HCL 2 MG/2ML IJ SOLN
INTRAMUSCULAR | Status: DC | PRN
  Administered 2020-07-09: 2 mg via INTRAVENOUS

## 2020-07-09 MED ORDER — BUPIVACAINE HCL 0.25 % IJ SOLN
INTRAMUSCULAR | Status: DC | PRN
  Administered 2020-07-09: 10 mL

## 2020-07-09 MED ORDER — ONDANSETRON HCL 4 MG/2ML IJ SOLN: 4.0000 mg | Freq: Once | INTRAMUSCULAR | Status: DC | PRN

## 2020-07-09 MED ORDER — FENTANYL CITRATE (PF) 100 MCG/2ML IJ SOLN
INTRAMUSCULAR | Status: AC
  Filled 2020-07-09: qty 2

## 2020-07-09 MED ORDER — SCOPOLAMINE 1 MG/3DAYS TD PT72
1.0000 | MEDICATED_PATCH | TRANSDERMAL | Status: DC
  Administered 2020-07-09: 1.5 mg via TRANSDERMAL

## 2020-07-09 MED ORDER — ACETIC ACID 5 % SOLN
Status: DC | PRN
  Administered 2020-07-09: 1 via TOPICAL

## 2020-07-09 MED ORDER — ONDANSETRON HCL 4 MG/2ML IJ SOLN
INTRAMUSCULAR | Status: DC | PRN
  Administered 2020-07-09: 4 mg via INTRAVENOUS

## 2020-07-09 SURGICAL SUPPLY — 45 items
BLADE CLIPPER SENSICLIP SURGIC (BLADE) ×3 IMPLANT
BLADE SURG 15 STRL LF DISP TIS (BLADE) ×2 IMPLANT
BLADE SURG 15 STRL SS (BLADE) ×4
CANISTER SUCT 1200ML W/VALVE (MISCELLANEOUS) IMPLANT
CANISTER SUCT 3000ML PPV (MISCELLANEOUS) ×1 IMPLANT
CATH ROBINSON RED A/P 16FR (CATHETERS) ×4 IMPLANT
COVER WAND RF STERILE (DRAPES) ×4 IMPLANT
DEPRESSOR TONGUE BLADE STERILE (MISCELLANEOUS) ×4 IMPLANT
DRSG TELFA 3X8 NADH (GAUZE/BANDAGES/DRESSINGS) IMPLANT
ELECT BALL LEEP 3MM BLK (ELECTRODE) IMPLANT
GAUZE 4X4 16PLY RFD (DISPOSABLE) ×4 IMPLANT
GLOVE BIO SURGEON STRL SZ 6 (GLOVE) ×8 IMPLANT
GOWN STRL REUS W/TWL LRG LVL3 (GOWN DISPOSABLE) ×4 IMPLANT
KIT TURNOVER CYSTO (KITS) ×4 IMPLANT
NDL HYPO 25X1 1.5 SAFETY (NEEDLE) ×1 IMPLANT
NEEDLE HYPO 25X1 1.5 SAFETY (NEEDLE) ×4 IMPLANT
NS IRRIG 1000ML POUR BTL (IV SOLUTION) ×3 IMPLANT
NS IRRIG 500ML POUR BTL (IV SOLUTION) ×1 IMPLANT
PACK PERINEAL COLD (PAD) ×4 IMPLANT
PACK VAGINAL WOMENS (CUSTOM PROCEDURE TRAY) ×4 IMPLANT
PAD DRESSING TELFA 3X8 NADH (GAUZE/BANDAGES/DRESSINGS) IMPLANT
PAD PREP 24X48 CUFFED NSTRL (MISCELLANEOUS) ×4 IMPLANT
PUNCH BIOPSY DERMAL 3 (INSTRUMENTS) IMPLANT
PUNCH BIOPSY DERMAL 3MM (INSTRUMENTS)
PUNCH BIOPSY DERMAL 4MM (INSTRUMENTS) IMPLANT
SUT VIC AB 0 CT1 36 (SUTURE) IMPLANT
SUT VIC AB 0 SH 27 (SUTURE) ×4 IMPLANT
SUT VIC AB 2-0 CT2 27 (SUTURE) IMPLANT
SUT VIC AB 2-0 SH 27 (SUTURE)
SUT VIC AB 2-0 SH 27XBRD (SUTURE) IMPLANT
SUT VIC AB 3-0 PS2 18 (SUTURE)
SUT VIC AB 3-0 PS2 18XBRD (SUTURE) IMPLANT
SUT VIC AB 3-0 SH 27 (SUTURE) ×4
SUT VIC AB 3-0 SH 27X BRD (SUTURE) ×2 IMPLANT
SUT VIC AB 4-0 PS2 18 (SUTURE) ×10 IMPLANT
SUT VICRYL 0 UR6 27IN ABS (SUTURE) IMPLANT
SUT VICRYL 4-0 PS2 18IN ABS (SUTURE) IMPLANT
SWAB OB GYN 8IN STERILE 2PK (MISCELLANEOUS) ×8 IMPLANT
SYR BULB IRRIG 60ML STRL (SYRINGE) ×4 IMPLANT
TOWEL OR 17X26 10 PK STRL BLUE (TOWEL DISPOSABLE) ×4 IMPLANT
TUBE CONNECTING 12'X1/4 (SUCTIONS)
TUBE CONNECTING 12X1/4 (SUCTIONS) IMPLANT
VACUUM HOSE 7/8X10 W/ WAND (MISCELLANEOUS) IMPLANT
VACUUM HOSE/TUBING 7/8INX6FT (MISCELLANEOUS) ×4 IMPLANT
WATER STERILE IRR 500ML POUR (IV SOLUTION) ×1 IMPLANT

## 2020-07-09 NOTE — Interval H&P Note (Signed)
History and Physical Interval Note:  07/09/2020 7:28 AM  [name]  has presented today for surgery, with the diagnosis of VULVAR INTRAEPITHELIAL NEOPLASIA III.  The various methods of treatment have been discussed with the patient and family. After consideration of risks, benefits and other options for treatment, the patient has consented to  Procedure(s): WIDE EXCISION VULVECTOMY (N/A) CO2 LASER APPLICATION (N/A) as a surgical intervention.  The patient's history has been reviewed, patient examined, no change in status, stable for surgery.  I have reviewed the patient's chart and labs.  Questions were answered to the patient's satisfaction.     Carver Fila

## 2020-07-09 NOTE — Transfer of Care (Signed)
Immediate Anesthesia Transfer of Care Note  Patient: [name]  Procedure(s) Performed: Procedure(s) (LRB): WIDE EXCISION VULVECTOMY, BIOPSY OF RIGHT LEG LESION (N/A)  Patient Location: PACU  Anesthesia Type: General  Level of Consciousness: awake, oriented, sedated and patient cooperative  Airway & Oxygen Therapy: Patient Spontanous Breathing and Patient connected to face mask oxygen  Post-op Assessment: Report given to PACU RN and Post -op Vital signs reviewed and stable  Post vital signs: Reviewed and stable  Complications: No apparent anesthesia complications  Last Vitals:  Vitals Value Taken Time  BP 148/97 07/09/20 1019  Temp 36.7 C 07/09/20 1019  Pulse 71 07/09/20 1020  Resp 13 07/09/20 1020  SpO2 98 % 07/09/20 1020  Vitals shown include unvalidated device data.  Last Pain:  Vitals:   07/09/20 0758  TempSrc: Oral  PainSc: 5       Patients Stated Pain Goal: 5 (07/09/20 0758)  Complications: No complications documented.

## 2020-07-09 NOTE — Anesthesia Procedure Notes (Signed)
Procedure Name: LMA Insertion Date/Time: 07/09/2020 9:16 AM Performed by: Francie Massing, CRNA Pre-anesthesia Checklist: Patient identified, Emergency Drugs available, Suction available and Patient being monitored Patient Re-evaluated:Patient Re-evaluated prior to induction Oxygen Delivery Method: Circle system utilized Preoxygenation: Pre-oxygenation with 100% oxygen Induction Type: IV induction Ventilation: Mask ventilation without difficulty LMA: LMA inserted LMA Size: 4.0 Number of attempts: 1 Airway Equipment and Method: Bite block Placement Confirmation: positive ETCO2 Tube secured with: Tape Dental Injury: Teeth and Oropharynx as per pre-operative assessment

## 2020-07-09 NOTE — Op Note (Signed)
PATIENT: [name] DATE: 07/09/20   Preop Diagnosis:   Postoperative Diagnosis:   Surgery: Partial simple left vulvectomy  Surgeons:  Eugene Garnet, MD Assistant: Warner Mccreedy, NP  Anesthesia: LMA  Estimated blood loss: 50 ml  IVF:  See I&O flowsheet   Urine output: none, no catheterization during surgery   Complications: None apparent  Pathology: left anterior vulva with marking stitch at 12 o'clock; medial margin; right thigh lesion  Operative findings: 1cm right polypoid lesion on the inner thigh, second 66mm raised lesion, both resected. On EUA, 3cmx3cm plaque with some desquamation and other portions with leukoplacia. After application of acetic acid, acetowhite noted across most of the lesion. Lesion abuts clitoral hood and clitoris medially.   Procedure: The patient was identified in the preoperative holding area. Informed consent was signed on the chart. Patient was seen history was reviewed and exam was performed.   The patient was then taken to the operating room and placed in the supine position with SCD hose on. General anesthesia was then induced without difficulty. She was then placed in the dorsolithotomy position. The perineum was prepped with Betadine. The vagina was prepped with Betadine. The patient was then draped after the prep was dried.   Timeout was performed the patient, procedure, antibiotic, allergy, and length of procedure. 5% acetic acid solution was applied to the perineum. The vulvar tissues were inspected for areas of acetowhite changes or leukoplakia. The lesion was identified and the marking pen was used to circumscribe the area with appropriate surgical margins. A 1cm margin was not feasible medially without resecting the clitoral hood and clitoris. The patient and I had discussed pre-op that I would resect up to both structures and that if pathology revealed carcinoma, that we would need to re-sect likely to achieve negative  margins. The subcuticular tissues were infiltrated with 1% lidocaine. The 15 blade scalpel was used to make an incision through the skin circumferentially as marked. The skin elipse was grasped and was separated from the underlying deep dermal tissues with the scalpel. After the specimen had been completely resected, it was oriented and marked at 12 o'clock with a 0-vicryl suture. The bovie was used to obtain hemostasis at the surgical bed. The subcutaneous tissues were irrigated and made hemostatic. An additional medial portion was taken from the upper vagina.   The deep dermal layer was approximated with 3-0 vicryl mattress sutures to bring the skin edges into approximation and off tension. The wound was closed following langher's lines. The cutaneous layer was closed with interrupted 4-0 vicryl stitches and mattress sutures to ensure a tension free and hemostatic closure. The perineum was again irrigated.   The two thigh lesions were removed with a scalpel, no significant margin taken. The incision was closed with several interrupted 4-0 Vicryl sutures.   All instrument, suture, laparotomy, Ray-Tec, and needle counts were correct x2. The patient tolerated the procedure well and was taken recovery room in stable condition.   Eugene Garnet MD Gynecologic Oncology

## 2020-07-09 NOTE — Anesthesia Postprocedure Evaluation (Signed)
Anesthesia Post Note  Patient: [name]  Procedure(s) Performed: WIDE EXCISION VULVECTOMY, BIOPSY OF RIGHT LEG LESION (N/A Vagina )     Patient location during evaluation: PACU Anesthesia Type: General Level of consciousness: awake and alert Pain management: pain level controlled Vital Signs Assessment: post-procedure vital signs reviewed and stable Respiratory status: spontaneous breathing, nonlabored ventilation, respiratory function stable and patient connected to nasal cannula oxygen Cardiovascular status: blood pressure returned to baseline and stable Postop Assessment: no apparent nausea or vomiting Anesthetic complications: no   No complications documented.  Last Vitals:  Vitals:   07/09/20 1100 07/09/20 1200  BP: 139/90 140/87  Pulse: (!) 56 66  Resp: 13 14  Temp:  36.6 C  SpO2: 96% 98%    Last Pain:  Vitals:   07/09/20 1200  TempSrc:   PainSc: 5                  Trevor Iha

## 2020-07-09 NOTE — Discharge Instructions (Addendum)
07/09/2020  Return to work: 2-4 weeks if applicable  Restart your turmeric and fish oil one week after surgery.  Half the time, the incision can open up and it will heal from the inside out. You can also see yellowish stringy drainage called fibrinous drainage from the incision which can be normal.  We recommend purchasing several bags of frozen green peas and dividing them into ziploc bags. You will want to keep these in the freezer and have them ready to use as ice packs to the vulvar incision. Once the ice pack is no longer cold, you can get another from the freezer. The frozen peas mold to your body better than a regular ice pack.  Activity: 1. Be up and out of the bed during the day.  Take a nap if needed.  You may walk up steps but be careful and use the hand rail.  Stair climbing will tire you more than you think, you may need to stop part way and rest.   2. No lifting or straining for 4 weeks.  3. No driving for minimum of 48 hours from the procedure.  Do not drive if you are taking narcotic pain medicine.  4. Shower daily.  Use soap and water on your incision and pat dry; don't rub.  No tub baths until cleared by your surgeon.   5. No sexual activity and nothing in the vagina for 4-6 weeks.  6. You may experience a small amount of drainage from your incisions, which is normal.  If the drainage persists or increases, please call the office.  7. Take Tylenol or ibuprofen first for pain and only use narcotic pain medication for severe pain not relieved by the Tylenol or Ibuprofen.  DO NOT TAKE IBUPROFEN AND NAPROXEN TOGETHER SINCE THEY WORK SIMILARLY. Monitor your Tylenol intake to a max of 4,000 mg.  Diet: 1. Low sodium Heart Healthy Diet is recommended.  2. It is safe to use a laxative, such as Miralax or Colace, if you have difficulty moving your bowels. You can take Sennakot at bedtime every evening to keep bowel movements regular and to prevent constipation.    Wound  Care: 1. Keep clean and dry.  Shower daily.  Reasons to call the Doctor:  Fever - Oral temperature greater than 100.4 degrees Fahrenheit  Foul-smelling vaginal discharge  Difficulty urinating  Nausea and vomiting  Increased pain at the site of the incision that is unrelieved with pain medicine.  Difficulty breathing with or without chest pain  New calf pain especially if only on one side  Sudden, continuing increased vaginal bleeding with or without clots.   Contacts: For questions or concerns you should contact:  Dr. Eugene Garnet at [phone]  Warner Mccreedy, NP at [phone]  After Hours: call [phone] and have the GYN Oncologist paged/contacted   Post Anesthesia Home Care Instructions  Activity: Get plenty of rest for the remainder of the day. A responsible individual must stay with you for 24 hours following the procedure.  For the next 24 hours, DO NOT: -Drive a car -Advertising copywriter -Drink alcoholic beverages -Take any medication unless instructed by your physician -Make any legal decisions or sign important papers.  Meals: Start with liquid foods such as gelatin or soup. Progress to regular foods as tolerated. Avoid greasy, spicy, heavy foods. If nausea and/or vomiting occur, drink only clear liquids until the nausea and/or vomiting subsides. Call your physician if vomiting continues.  Special Instructions/Symptoms: Your throat may feel dry or  sore from the anesthesia or the breathing tube placed in your throat during surgery. If this causes discomfort, gargle with warm salt water. The discomfort should disappear within 24 hours.  If you had a scopolamine patch placed behind your ear for the management of post- operative nausea and/or vomiting:  1. The medication in the patch is effective for 72 hours, after which it should be removed.  Wrap patch in a tissue and discard in the trash. Wash hands thoroughly with soap and water. 2. You may remove  the patch earlier than 72 hours if you experience unpleasant side effects which may include dry mouth, dizziness or visual disturbances. 3. Avoid touching the patch. Wash your hands with soap and water after contact with the patch.    Remove scopolamine patch behind Left ear by Friday, December 3rd, 2021.  Next dose of Tylenol to be taken after 12 PM (noon) today.

## 2020-07-10 ENCOUNTER — Telehealth: Payer: Self-pay

## 2020-07-10 NOTE — Telephone Encounter (Signed)
[name] states that she is eating,drinking, and urinating well. Using forzen peas as directed as well as spritz bottle. Passing gas. Took capful of Miralax this am. Will add colace today and repeat the Miralax to night and use bid prn Afebrile. Pain controled with current pain meds prescribed. Incisions with slight serosanguinous drainage. Pt aware of post op appointments as well as the office number [phone] and after hours number [phone] to call if she has any questions or concerns

## 2020-07-11 ENCOUNTER — Encounter (HOSPITAL_BASED_OUTPATIENT_CLINIC_OR_DEPARTMENT_OTHER): Payer: Self-pay | Admitting: Gynecologic Oncology

## 2020-07-11 LAB — SURGICAL PATHOLOGY

## 2020-07-12 ENCOUNTER — Telehealth: Payer: Self-pay | Admitting: Gynecologic Oncology

## 2020-07-12 NOTE — Telephone Encounter (Signed)
Called patient and discussed final pathology from surgery this week. She is happy with news. Overall doing well. Aware of date/time of follow-up.  Eugene Garnet MD Gynecologic Oncology

## 2020-07-16 ENCOUNTER — Other Ambulatory Visit: Payer: Self-pay | Admitting: Gynecologic Oncology

## 2020-07-16 ENCOUNTER — Telehealth: Payer: Self-pay

## 2020-07-16 DIAGNOSIS — D071 Carcinoma in situ of vulva: Secondary | ICD-10-CM

## 2020-07-16 MED ORDER — OXYCODONE HCL 5 MG PO TABS: 5.0000 mg | ORAL_TABLET | ORAL | 0 refills | Status: DC | PRN

## 2020-07-16 NOTE — Progress Notes (Signed)
See LPN note. Refill for 10 oxycodone per Dr. Pricilla Holm for post-op pain.

## 2020-07-16 NOTE — Telephone Encounter (Signed)
TC from patient.  [name] is 8 days out from left simple vulvectomy.  Patient stated she was doing ok until long acting anesthesia wore off.  Patient stated her pubic hair is growing out and is "poking" her surgical site. Patient stated she is unable to take ibuprofen due to stomach upset and has been taking Aleve and oxycodone.  Patient stated ice pack is not helpful to area and questions using an numbing type cream.  Patient instructed to avoid using any creams or salves on area before healed.  Patient requests 1 week refill of oxycodone.

## 2020-07-25 ENCOUNTER — Telehealth: Payer: Self-pay

## 2020-07-25 NOTE — Telephone Encounter (Signed)
TC from patient w/ c/o  surgical site "skin is sticking to itself". Patient stated she has been in bed more to alleviate discomfort. Patient stated she cannot separate labia and a scab has formed over site.  Patient instructed to liberally spritz surgical site and apply wet warm compress to site to loosen.  Patient verbalized understanding of all information provided.

## 2020-07-29 ENCOUNTER — Encounter: Payer: Self-pay | Admitting: Gynecologic Oncology

## 2020-07-29 ENCOUNTER — Inpatient Hospital Stay: Payer: Medicaid Other | Attending: Gynecologic Oncology | Admitting: Gynecologic Oncology

## 2020-07-29 ENCOUNTER — Other Ambulatory Visit: Payer: Self-pay

## 2020-07-29 VITALS — BP 146/117 | HR 73 | Temp 98.2°F | Resp 16 | Ht 66.0 in | Wt 190.5 lb

## 2020-07-29 DIAGNOSIS — Z9079 Acquired absence of other genital organ(s): Secondary | ICD-10-CM

## 2020-07-29 DIAGNOSIS — D071 Carcinoma in situ of vulva: Secondary | ICD-10-CM

## 2020-07-29 NOTE — Patient Instructions (Signed)
You vulvar incision is healing well. Since it opened along much of it, I'd like to see you again in 4-5 weeks for another exam.  For your thigh wound, I'd try some neosporin and looser fitting (or no) clothing.

## 2020-07-29 NOTE — Progress Notes (Signed)
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  Interval History: Overall has been doing well since surgery.  Used warm compresses initially.  Denies having used any sitz bath since surgery.  She denies any pain currently.  Has been using Aleve intermittently as needed.  She has occasional pink discharge and small quantity of bleeding from time to time.  Has had trouble with the inner right thigh lesion.  Because of what she is wearing and movement, has ripped off the scab that has formed there multiple times.  Reports normal bowel and bladder function.  Has some early satiety, denies any nausea or emesis.  Past Medical/Surgical History: Past Medical History:  Diagnosis Date  . Arthritis   . Chronic back pain   . Depression   . Fibromyalgia   . GERD (gastroesophageal reflux disease)   . Hiatal hernia   . History of gastritis   . Hypertension    followed by pcp  . Hypothyroidism    followed by pcp  . Mixed hyperlipidemia   . SOB (shortness of breath)    per pt gets winded at times  . VIN III (vulvar intraepithelial neoplasia III)   . Wears glasses     Past Surgical History:  Procedure Laterality Date  . ANTERIOR CERVICAL DECOMP/DISCECTOMY FUSION  09-21-2005  @[facility]    C5--7  . CARPAL TUNNEL RELEASE Bilateral 2006  . HEMORROIDECTOMY  2004  . LAMINECTOMY AND MICRODISCECTOMY LUMBAR SPINE  06-05-2011  @[facility]    L2--3  . LAPAROSCOPIC CHOLECYSTECTOMY  2004  . LUMBAR FUSION  12-08-2006  @[facility]    L4--5  . LUMBAR SPINE HARDWARE REMOVAL  10/02/2009  @[facility]    L4--5  . POSTERIOR LUMBAR FUSION  06-08-2012  @[facility]    re-do  L2--3  . ULNAR TUNNEL RELEASE Left 2010  . VAGINAL HYSTERECTOMY  12-01-2002  @[facility]   . VULVECTOMY N/A 07/09/2020   Procedure: WIDE  EXCISION VULVECTOMY, BIOPSY OF RIGHT LEG LESION;  Surgeon: 06-10-2012, MD;  Location: [facility] [place];  Service: Gynecology;  Laterality: N/A;    Family History  Problem Relation Age of Onset  . Ovarian cancer Mother   . Skin cancer Mother   . Bladder Cancer Father   . Leukemia Sister   . Breast cancer Neg Hx   . Colon cancer Neg Hx     Social History   Socioeconomic History  . Marital status: Single    Spouse name: Not on file  . Number of children: Not on file  . Years of education: Not on file  . Highest education level: Not on file  Occupational History  . Occupation: on disability  Tobacco Use  . Smoking status: Former Smoker    Packs/day: 1.00    Years: 20.00    Pack years: 20.00    Types: Cigarettes    Quit date: 11/28/2018    Years since quitting: 1.6  . Smokeless tobacco: Never Used  Vaping Use  . Vaping Use: Never used  Substance and Sexual Activity  . Alcohol use: Yes    Comment: occasional  . Drug use: Never  . Sexual activity: Yes    Birth control/protection: Surgical  Other Topics Concern  . Not on file  Social History Narrative  . Not on file   Social Determinants of Health  Financial Resource Strain: Low Risk   . Difficulty of Paying Living Expenses: Not very hard  Food Insecurity: Food Insecurity Present  . Worried About Programme researcher, broadcasting/film/video in the Last Year: Sometimes true  . Ran Out of Food in the Last Year: Sometimes true  Transportation Needs: No Transportation Needs  . Lack of Transportation (Medical): No  . Lack of Transportation (Non-Medical): No  Physical Activity: Insufficiently Active  . Days of Exercise per Week: 1 day  . Minutes of Exercise per Session: 10 min  Stress: Stress Concern Present  . Feeling of Stress : To some extent  Social Connections: Moderately Isolated  . Frequency of Communication with Friends and Family: Three times a week  . Frequency of Social Gatherings with Friends and Family: Once a  week  . Attends Religious Services: Never  . Active Member of Clubs or Organizations: No  . Attends Banker Meetings: Never  . Marital Status: Living with partner    Current Medications:  Current Outpatient Medications:  .  cimetidine (TAGAMET) 200 MG tablet, Take 200 mg by mouth 2 (two) times daily., Disp: , Rfl:  .  EUTHYROX 75 MCG tablet, Take 75 mcg by mouth daily., Disp: , Rfl:  .  ibuprofen (ADVIL) 800 MG tablet, Take 1 tablet (800 mg total) by mouth every 8 (eight) hours as needed for moderate pain. For AFTER surgery only (Patient not taking: Reported on 06/27/2020), Disp: 30 tablet, Rfl: 0 .  Magnesium 200 MG TABS, Take by mouth in the morning and at bedtime. Per pt takes for nerve pain, Disp: , Rfl:  .  metoprolol tartrate (LOPRESSOR) 25 MG tablet, Take 25 mg by mouth 2 (two) times daily., Disp: , Rfl:  .  MILK THISTLE PO, Take by mouth daily. , Disp: , Rfl:  .  Multiple Vitamin (MULTIVITAMIN) capsule, Take 1 capsule by mouth daily., Disp: , Rfl:  .  naproxen (NAPROSYN) 500 MG tablet, Take by mouth as needed. , Disp: , Rfl:  .  oxyCODONE (OXY IR/ROXICODONE) 5 MG immediate release tablet, Take 1 tablet (5 mg total) by mouth every 4 (four) hours as needed for severe pain. For AFTER surgery only, do not take and drive, Disp: 10 tablet, Rfl: 0 .  senna-docusate (SENOKOT-S) 8.6-50 MG tablet, Take 2 tablets by mouth at bedtime. For AFTER surgery, do not take if having diarrhea (Patient not taking: Reported on 06/27/2020), Disp: 30 tablet, Rfl: 1 .  sodium chloride (OCEAN) 0.65 % nasal spray, Place 2 sprays into the nose daily as needed. For nasal congestion, Disp: , Rfl:   Review of Systems: Pertinent positives include appetite change, fatigue, early satiety, wounds, anxiety, depression. Denies fevers, chills, unexplained weight changes. Denies hearing loss, neck lumps or masses, mouth sores, ringing in ears or voice changes. Denies cough or wheezing.  Denies shortness of  breath. Denies chest pain or palpitations. Denies leg swelling. Denies abdominal distention, pain, blood in stools, constipation, diarrhea, nausea, vomiting. Denies pain with intercourse, dysuria, frequency, hematuria or incontinence. Denies hot flashes, pelvic pain, vaginal bleeding or vaginal discharge.   Denies joint pain, back pain or muscle pain/cramps. Denies itching, rash. Denies dizziness, headaches, numbness or seizures. Denies swollen lymph nodes or glands, denies easy bruising or bleeding. Denies confusion, or decreased concentration.  Physical Exam: BP (!) 146/117 (BP Location: Left Arm, Patient Position: Sitting)   Pulse 73   Temp 98.2 F (36.8 C) (Tympanic)   Resp 16   Ht 5\' 6"  (1.676 m)  Wt 190 lb 7.6 oz (86.4 kg)   SpO2 99%   BMI 30.74 kg/m  General: Alert, oriented, no acute distress. HEENT: Posterior oropharynx clear, sclera anicteric. Chest: Unlabored breathing on room air. Extremities: Grossly normal range of motion.  Warm, well perfused.  No edema bilaterally.  Healing 1 cm right thigh lesion.  Superficial layers of skin and subcutaneous tissue are absent, some mild induration around the incision but no erythema or exudate. Skin: No rashes or lesions noted. GU: External genitalia notable for opening of her left vulvar incision along most of the length of the incision from just lateral to the clitoris down to the mid vagina.  Tissue is somewhat beefy in appearance and healing well.  Minimal exudate noted.  Some sutures present still.  No significant pain with palpation over the area.  Laboratory & Radiologic Studies: A. SOFT TISSUE, RIGHT THIGH LESION, EXCISION:  - Ruptured epidermoid cyst with associated chronic inflammation and  foreign body giant cell reaction  - No malignancy identified   B. VULVA, LEFT, VULVECTOMY:  - High grade squamous intraepithelial lesion (VIN 3, severe  dysplasia/carcinoma in situ)  - Dysplasia extends to the inked resection  margins  - See comment   C. MEDIAL MARGIN, LEFT VULVA, EXCISION:  - Benign squamous epithelium  - No residual high-grade dysplasia identified   COMMENT:   B. High-grade dysplasia extends to the inked 9:00 margin (orange).   Assessment & Plan: [name] is a 54 y.o. woman with high-grade vulvar dysplasia status post resection with a positive margin at the clitoris and clitoral hood resection site who is incisions open postoperatively but is healing well.  From a symptom standpoint, the patient is improving with time.  There is no sign of infection on her exam today.  I had like to see her again in 4 weeks given opening of the incision as well as to reassess the clitoris and clitoral hood.  As I had discussed with her before surgery, I resected up to but did not achieve a negative margin so as to preserve the clitoris and part of the clitoral hood.  My hope is that she will have an immune response given surgery and make clear the remaining dysplasia.  We discussed today that this will be something that she will need to be followed for long-term.  I suggested that she could start sitz bath's once or twice a day but I would avoid soaking in a hot tub until she has fully healed.  22 minutes of total time was spent for this patient encounter, including preparation, face-to-face counseling with the patient and coordination of care, and documentation of the encounter.  Eugene Garnet, MD  Division of Gynecologic Oncology  Department of Obstetrics and Gynecology  [facility]

## 2020-09-02 ENCOUNTER — Ambulatory Visit: Payer: Medicaid Other | Admitting: Gynecologic Oncology

## 2020-09-20 ENCOUNTER — Encounter: Payer: Self-pay | Admitting: Gynecologic Oncology

## 2020-09-20 ENCOUNTER — Other Ambulatory Visit: Payer: Self-pay

## 2020-09-20 ENCOUNTER — Inpatient Hospital Stay: Payer: Medicaid Other | Attending: Gynecologic Oncology | Admitting: Gynecologic Oncology

## 2020-09-20 VITALS — BP 136/87 | HR 72 | Temp 98.6°F | Resp 20 | Ht 66.0 in | Wt 187.0 lb

## 2020-09-20 DIAGNOSIS — D071 Carcinoma in situ of vulva: Secondary | ICD-10-CM | POA: Insufficient documentation

## 2020-09-20 DIAGNOSIS — I10 Essential (primary) hypertension: Secondary | ICD-10-CM | POA: Insufficient documentation

## 2020-09-20 DIAGNOSIS — Z87891 Personal history of nicotine dependence: Secondary | ICD-10-CM | POA: Diagnosis not present

## 2020-09-20 DIAGNOSIS — N905 Atrophy of vulva: Secondary | ICD-10-CM | POA: Diagnosis not present

## 2020-09-20 DIAGNOSIS — K219 Gastro-esophageal reflux disease without esophagitis: Secondary | ICD-10-CM | POA: Insufficient documentation

## 2020-09-20 DIAGNOSIS — E039 Hypothyroidism, unspecified: Secondary | ICD-10-CM | POA: Insufficient documentation

## 2020-09-20 MED ORDER — ESTROGENS, CONJUGATED 0.625 MG/GM VA CREA: 1.0000 | TOPICAL_CREAM | VAGINAL | 1 refills | Status: AC

## 2020-09-20 NOTE — Progress Notes (Signed)
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  Interval History: Patient reports doing very well since her last visit with me.  She denies any bleeding, pruritus, or discharge from her incision.  She has looked at her labia and feels like things are healing well.  She has very occasional minor pain and some numbness, otherwise denies any significant pain.  Past Medical/Surgical History: Past Medical History:  Diagnosis Date  . Arthritis   . Chronic back pain   . Depression   . Fibromyalgia   . GERD (gastroesophageal reflux disease)   . Hiatal hernia   . History of gastritis   . Hypertension    followed by pcp  . Hypothyroidism    followed by pcp  . Mixed hyperlipidemia   . SOB (shortness of breath)    per pt gets winded at times  . VIN III (vulvar intraepithelial neoplasia III)   . Wears glasses     Past Surgical History:  Procedure Laterality Date  . ANTERIOR CERVICAL DECOMP/DISCECTOMY FUSION  09-21-2005  @[facility]    C5--7  . CARPAL TUNNEL RELEASE Bilateral 2006  . HEMORROIDECTOMY  2004  . LAMINECTOMY AND MICRODISCECTOMY LUMBAR SPINE  06-05-2011  @[facility]    L2--3  . LAPAROSCOPIC CHOLECYSTECTOMY  2004  . LUMBAR FUSION  12-08-2006  @[facility]    L4--5  . LUMBAR SPINE HARDWARE REMOVAL  10/02/2009  @[facility]    L4--5  . POSTERIOR LUMBAR FUSION  06-08-2012  @[facility]    re-do  L2--3  . ULNAR TUNNEL RELEASE Left 2010  . VAGINAL HYSTERECTOMY  12-01-2002  @[facility]   . VULVECTOMY N/A 07/09/2020   Procedure: WIDE EXCISION VULVECTOMY, BIOPSY OF RIGHT LEG LESION;  Surgeon: 06-10-2012, MD;  Location: [facility];  Service: Gynecology;  Laterality: N/A;    Family History  Problem Relation Age of Onset  . Ovarian cancer Mother   . Skin cancer Mother   .  Bladder Cancer Father   . Leukemia Sister   . Breast cancer Neg Hx   . Colon cancer Neg Hx     Social History   Socioeconomic History  . Marital status: Single    Spouse name: Not on file  . Number of children: Not on file  . Years of education: Not on file  . Highest education level: Not on file  Occupational History  . Occupation: on disability  Tobacco Use  . Smoking status: Former Smoker    Packs/day: 1.00    Years: 20.00    Pack years: 20.00    Types: Cigarettes    Quit date: 11/28/2018    Years since quitting: 1.8  . Smokeless tobacco: Never Used  Vaping Use  . Vaping Use: Never used  Substance and Sexual Activity  . Alcohol use: Yes    Comment: occasional  . Drug use: Never  . Sexual activity: Yes    Birth control/protection: Surgical  Other Topics Concern  . Not on file  Social History Narrative  . Not on file   Social Determinants of Health   Financial Resource Strain: Low Risk   . Difficulty of Paying Living Expenses: Not very hard  Food Insecurity: Food Insecurity Present  . Worried About 12-03-2002 in the Last Year: Sometimes true  . Ran Out of Food in the Last Year: Sometimes  true  Transportation Needs: No Transportation Needs  . Lack of Transportation (Medical): No  . Lack of Transportation (Non-Medical): No  Physical Activity: Insufficiently Active  . Days of Exercise per Week: 1 day  . Minutes of Exercise per Session: 10 min  Stress: Stress Concern Present  . Feeling of Stress : To some extent  Social Connections: Moderately Isolated  . Frequency of Communication with Friends and Family: Three times a week  . Frequency of Social Gatherings with Friends and Family: Once a week  . Attends Religious Services: Never  . Active Member of Clubs or Organizations: No  . Attends Banker Meetings: Never  . Marital Status: Living with partner    Current Medications:  Current Outpatient Medications:  .  cimetidine (TAGAMET) 200  MG tablet, Take 200 mg by mouth 2 (two) times daily., Disp: , Rfl:  .  conjugated estrogens (PREMARIN) vaginal cream, Place 1 Applicatorful vaginally 3 (three) times a week., Disp: 42.5 g, Rfl: 1 .  metoprolol tartrate (LOPRESSOR) 25 MG tablet, Take 25 mg by mouth 2 (two) times daily., Disp: , Rfl:  .  MILK THISTLE PO, Take by mouth daily. , Disp: , Rfl:  .  Multiple Vitamin (MULTIVITAMIN) capsule, Take 1 capsule by mouth daily., Disp: , Rfl:  .  naproxen (NAPROSYN) 500 MG tablet, Take by mouth as needed. , Disp: , Rfl:  .  Omega-3 1000 MG CAPS, Take 2,000 mg by mouth 2 (two) times daily., Disp: , Rfl:  .  sodium chloride (OCEAN) 0.65 % nasal spray, Place 2 sprays into the nose daily as needed. For nasal congestion, Disp: , Rfl:  .  Turmeric 400 MG CAPS, Take 400 mg by mouth in the morning and at bedtime., Disp: , Rfl:  .  EUTHYROX 75 MCG tablet, Take 75 mcg by mouth daily., Disp: , Rfl:  .  Magnesium 200 MG TABS, Take by mouth in the morning and at bedtime. Per pt takes for nerve pain, Disp: , Rfl:   Review of Systems: Denies appetite changes, fevers, chills, fatigue, unexplained weight changes. Denies hearing loss, neck lumps or masses, mouth sores, ringing in ears or voice changes. Denies cough or wheezing.  Denies shortness of breath. Denies chest pain or palpitations. Denies leg swelling. Denies abdominal distention, pain, blood in stools, constipation, diarrhea, nausea, vomiting, or early satiety. Denies pain with intercourse, dysuria, frequency, hematuria or incontinence. Denies hot flashes, pelvic pain, vaginal bleeding or vaginal discharge.   Denies joint pain, back pain or muscle pain/cramps. Denies itching, rash, or wounds. Denies dizziness, headaches, numbness or seizures. Denies swollen lymph nodes or glands, denies easy bruising or bleeding. Denies anxiety, depression, confusion, or decreased concentration.  Physical Exam: BP 136/87 (BP Location: Left Arm)   Pulse 72   Temp  98.6 F (37 C) (Tympanic)   Resp 20   Ht 5\' 6"  (1.676 m)   Wt 187 lb (84.8 kg)   SpO2 99% Comment: ra  BMI 30.18 kg/m  General: Alert, oriented, no acute distress. HEENT: Normocephalic, atraumatic, sclera anicteric. Chest: Unlabored breathing on room air. Extremities: Grossly normal range of motion.  Warm, well perfused.  No edema bilaterally. Skin: No rashes or lesions noted. GU: Left vulvar incision has healed almost completely.  There is some mild thinning of the tissue, with out erythema, induration, or exudate.  No pain with palpation.  No discrete lesions noted.  Clitoris normal in appearance.  Laboratory & Radiologic Studies: None new  Assessment & Plan:  [name] is a 55 y.o. woman with high-grade vulvar dysplasia status post resection in November presenting for wound check with healed incision.  Patient has continued to heal well and her incision is almost completely healed at this time.  Given positive margin at the clitoris, I recommend that she have close follow-up.  I would recommend follow-up interval of 6 months for at least 1 to 2 years.  If no new lesions, then follow-up visits could be spaced out to yearly.  Patient also instructed on signs and symptoms that would be concerning for disease recurrence and she knows to call her gynecologist if these occur.  In terms of vulvar and vaginal atrophy as well as vulvar dysplasia, we discussed that there is some data for improved treatment of dysplasia when there is addition of estrogen topical therapy to the second dysplasia treatment.  Patient I discussed the role of estrogen use and she is interested in treatment.  I will send a prescription for vaginal estrogen to her pharmacy.  22 minutes of total time was spent for this patient encounter, including preparation, face-to-face counseling with the patient and coordination of care, and documentation of the encounter.  Eugene Garnet, MD  Division of Gynecologic Oncology   Department of Obstetrics and Gynecology  [facility]

## 2020-09-20 NOTE — Patient Instructions (Signed)
Good to see you today! Things are continuing to heal nicely.  I'm going to let Dr. Despina Hidden know I recommend a follow-up in 5 months. I'd recommend every 6 month visits for the next 1-2 years and if no new symptoms or spots on the vulva, then visits could be spaced out. If you start having any symptoms (itching, pain) or noticed a new lesion, please call to see him sooner.

## 2020-10-28 ENCOUNTER — Telehealth: Payer: Self-pay

## 2020-10-28 NOTE — Telephone Encounter (Signed)
Ms [name] states that she had a vulvectomy with Dr. Pricilla Holm on 07-09-21 for high grade vulvar dysplasia. She felt a new bump on the left side side of vagina between the vagina and anus.  It is not tender or itching.  It is the size of a pencil eraser.  She did not know who she needed to follow up with. She has an appointment with Dr. Despina Hidden in May. Told [name] that Dr. Pricilla Holm said in her note at visit 09-20-20  to notify Dr. Despina Hidden to be seen sooner then May if she notices and new lesions. Pt verbalized understanding.

## 2020-11-11 ENCOUNTER — Encounter: Payer: Self-pay | Admitting: Obstetrics & Gynecology

## 2020-11-11 ENCOUNTER — Other Ambulatory Visit: Payer: Self-pay

## 2020-11-11 ENCOUNTER — Ambulatory Visit (INDEPENDENT_AMBULATORY_CARE_PROVIDER_SITE_OTHER): Payer: Medicaid Other | Admitting: Obstetrics & Gynecology

## 2020-11-11 VITALS — BP 153/94 | HR 63 | Ht 66.0 in | Wt 188.0 lb

## 2020-11-11 DIAGNOSIS — D071 Carcinoma in situ of vulva: Secondary | ICD-10-CM

## 2020-11-11 NOTE — Progress Notes (Signed)
Chief Complaint  Patient presents with  . Gynecologic Exam      55 y.o. G8P4 No LMP recorded. Patient has had a hysterectomy. The current method of family planning is status post hysterectomy.  Outpatient Encounter Medications as of 11/11/2020  Medication Sig  . cimetidine (TAGAMET) 200 MG tablet Take 200 mg by mouth 2 (two) times daily.  Marland Kitchen conjugated estrogens (PREMARIN) vaginal cream Place 1 Applicatorful vaginally 3 (three) times a week.  . EUTHYROX 75 MCG tablet Take 75 mcg by mouth daily.  . Magnesium 200 MG TABS Take by mouth in the morning and at bedtime. Per pt takes for nerve pain  . metoprolol tartrate (LOPRESSOR) 25 MG tablet Take 25 mg by mouth 2 (two) times daily.  Marland Kitchen MILK THISTLE PO Take by mouth daily.   . Multiple Vitamin (MULTIVITAMIN) capsule Take 1 capsule by mouth daily.  . naproxen (NAPROSYN) 500 MG tablet Take by mouth as needed.   . Omega-3 1000 MG CAPS Take 2,000 mg by mouth 2 (two) times daily.  . sodium chloride (OCEAN) 0.65 % nasal spray Place 2 sprays into the nose daily as needed. For nasal congestion  . Turmeric 400 MG CAPS Take 400 mg by mouth in the morning and at bedtime.  . [DISCONTINUED] Turmeric 400 MG CAPS Take by mouth.   No facility-administered encounter medications on file as of 11/11/2020.    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 Denese Killings, GYN oncology and she underwent resection in November 2021 The pathology returned as high-grade dysplasia, VIN 3 No evidence of invasive carcinoma Positive margin at the clitoris which is not surprising given where it was involved This was a microscopic margin and not a gross margin  She is here today for follow-up to be scheduled every 6 months for maybe 2 years and then hopefully yearly There was an area that she had some concern about on the left  vulva No real symptoms associated just an area she felt  Past Medical History:  Diagnosis Date  . Arthritis   . Chronic back pain   . Depression   . Fibromyalgia   . GERD (gastroesophageal reflux disease)   . Hiatal hernia   . History of gastritis   . Hypertension    followed by pcp  . Hypothyroidism    followed by pcp  . Mixed hyperlipidemia   . SOB (shortness of breath)    per pt gets winded at times  . VIN III (vulvar intraepithelial neoplasia III)   . Wears glasses     Past Surgical History:  Procedure Laterality Date  . ANTERIOR CERVICAL DECOMP/DISCECTOMY FUSION  09-21-2005  @[facility]    C5--7  . CARPAL TUNNEL RELEASE Bilateral 2006  . HEMORROIDECTOMY  2004  . LAMINECTOMY AND MICRODISCECTOMY LUMBAR SPINE  06-05-2011  @[facility]    L2--3  . LAPAROSCOPIC CHOLECYSTECTOMY  2004  . LUMBAR FUSION  12-08-2006  @[facility]    L4--5  . LUMBAR SPINE HARDWARE REMOVAL  10/02/2009  @[facility]    L4--5  . POSTERIOR LUMBAR FUSION  06-08-2012  @[facility]    re-do  L2--3  . ULNAR TUNNEL RELEASE Left 2010  . VAGINAL HYSTERECTOMY  12-01-2002  @[facility]   . VULVECTOMY N/A 07/09/2020   Procedure: WIDE EXCISION VULVECTOMY, BIOPSY OF RIGHT LEG LESION;  Surgeon: 06-10-2012, MD;  Location: [facility];  Service: Gynecology;  Laterality: N/A;    OB History    Gravida  8   Para  4   Term      Preterm      AB      Living  4     SAB      IAB      Ectopic      Multiple      Live Births              Allergies  Allergen Reactions  . Hydrocodone Other (See Comments)    hyper  . Codeine     Hyper, anxious, can't sleep  . Ibuprofen Other (See Comments)    GI upset  . Morphine     Social History   Socioeconomic History  . Marital status: Media planner    Spouse name: Not on file  . Number of children: Not on file  . Years of education: Not on file  . Highest education level: Not on file  Occupational History  . Occupation: on disability  Tobacco Use  . Smoking status:  Former Smoker    Packs/day: 1.00    Years: 20.00    Pack years: 20.00    Types: Cigarettes    Quit date: 11/28/2018    Years since quitting: 1.9  . Smokeless tobacco: Never Used  Vaping Use  . Vaping Use: Never used  Substance and Sexual Activity  . Alcohol use: Yes    Comment: occasional  . Drug use: Never  . Sexual activity: Yes    Birth control/protection: Surgical  Other Topics Concern  . Not on file  Social History Narrative  . Not on file   Social Determinants of Health   Financial Resource Strain: Low Risk   . Difficulty of Paying Living Expenses: Not very hard  Food Insecurity: Food Insecurity Present  . Worried About Programme researcher, broadcasting/film/video in the Last Year: Sometimes true  . Ran Out of Food in the Last Year: Sometimes true  Transportation Needs: No Transportation Needs  . Lack of Transportation (Medical): No  . Lack of Transportation (Non-Medical): No  Physical Activity: Insufficiently Active  . Days of Exercise per Week: 1 day  . Minutes of Exercise per Session: 10 min  Stress: Stress Concern Present  . Feeling of Stress : To some extent  Social Connections: Moderately Isolated  . Frequency of Communication with Friends and Family: Three times a week  . Frequency of Social Gatherings with Friends and Family: Once a week  . Attends Religious Services: Never  . Active Member of Clubs or Organizations: No  . Attends Banker Meetings: Never  . Marital Status: Living with partner    Family History  Problem Relation Age of Onset  . Ovarian cancer Mother   . Skin cancer Mother   . Bladder Cancer Father   . Leukemia Sister   . Breast cancer Neg Hx   . Colon cancer Neg Hx     Medications:       Current Outpatient Medications:  .  cimetidine (TAGAMET) 200 MG tablet, Take 200 mg by mouth 2 (two) times daily., Disp: , Rfl:  .  conjugated estrogens (PREMARIN) vaginal cream, Place 1 Applicatorful vaginally 3 (three) times a week., Disp: 42.5 g, Rfl: 1 .   EUTHYROX 75 MCG tablet, Take 75 mcg by mouth daily., Disp: , Rfl:  .  Magnesium 200 MG TABS, Take by mouth in the morning and at bedtime. Per  pt takes for nerve pain, Disp: , Rfl:  .  metoprolol tartrate (LOPRESSOR) 25 MG tablet, Take 25 mg by mouth 2 (two) times daily., Disp: , Rfl:  .  MILK THISTLE PO, Take by mouth daily. , Disp: , Rfl:  .  Multiple Vitamin (MULTIVITAMIN) capsule, Take 1 capsule by mouth daily., Disp: , Rfl:  .  naproxen (NAPROSYN) 500 MG tablet, Take by mouth as needed. , Disp: , Rfl:  .  Omega-3 1000 MG CAPS, Take 2,000 mg by mouth 2 (two) times daily., Disp: , Rfl:  .  sodium chloride (OCEAN) 0.65 % nasal spray, Place 2 sprays into the nose daily as needed. For nasal congestion, Disp: , Rfl:  .  Turmeric 400 MG CAPS, Take 400 mg by mouth in the morning and at bedtime., Disp: , Rfl:   Objective Blood pressure (!) 153/94, pulse 63, height 5\' 6"  (1.676 m), weight 188 lb (85.3 kg).  Gen WDWN NAD On exam her surgical site is well-healed There are no areas on the left or right vulva that which are concerning for dysplasia There is no erythema or leukoplakia The area around the clitoris is also free of any obvious disease  Today her exam is completely normal without any evidence of persistence or recurrence  Pertinent ROS No burning with urination, frequency or urgency No nausea, vomiting or diarrhea Nor fever chills or other constitutional symptoms  Labs or studies Laboratory data from her biopsy I performed as well as her definitive surgical therapy were reviewed    Impression Diagnoses this Encounter::   ICD-10-CM   1. Severe vulvar dysplasia, histologically confirmed, S/P resection  D07.1     Established relevant diagnosis(es):   Plan/Recommendations: No orders of the defined types were placed in this encounter.   Labs or Scans Ordered: No orders of the defined types were placed in this encounter.   Management:: [email or web address] will be seen back here in the  office in 6 months for ongoing surveillance Should she have any issues in the meantime she will certainly let [name] know No further intervention or therapy is indicated at this time   Follow up Return in about 6 months (around 05/13/2021) for Follow up surveillance.      All questions were answered.

## 2020-12-12 ENCOUNTER — Encounter (INDEPENDENT_AMBULATORY_CARE_PROVIDER_SITE_OTHER): Payer: Self-pay | Admitting: *Deleted

## 2021-03-10 ENCOUNTER — Ambulatory Visit: Payer: Medicaid Other | Admitting: Obstetrics & Gynecology

## 2021-04-01 ENCOUNTER — Ambulatory Visit: Payer: Medicaid Other | Admitting: Obstetrics & Gynecology

## 2021-04-01 ENCOUNTER — Encounter: Payer: Self-pay | Admitting: Obstetrics & Gynecology

## 2021-04-01 ENCOUNTER — Other Ambulatory Visit: Payer: Self-pay

## 2021-04-01 VITALS — BP 135/90 | HR 63 | Ht 66.0 in | Wt 176.0 lb

## 2021-04-01 DIAGNOSIS — D071 Carcinoma in situ of vulva: Secondary | ICD-10-CM

## 2021-04-01 NOTE — Progress Notes (Unsigned)
Chief Complaint  Patient presents with   labia recheck      55 y.o. G8P4 No LMP recorded. Patient has had a hysterectomy. The current method of family planning is {contraception:315051}.  Outpatient Encounter Medications as of 04/01/2021  Medication Sig   amLODipine (NORVASC) 10 MG tablet Take 10 mg by mouth daily.   cimetidine (TAGAMET) 200 MG tablet Take 200 mg by mouth 2 (two) times daily.   conjugated estrogens (PREMARIN) vaginal cream Place 1 Applicatorful vaginally 3 (three) times a week.   EUTHYROX 75 MCG tablet Take 75 mcg by mouth daily.   Magnesium 200 MG TABS Take by mouth in the morning and at bedtime. Per pt takes for nerve pain   MILK THISTLE PO Take by mouth daily.    Multiple Vitamin (MULTIVITAMIN) capsule Take 1 capsule by mouth daily.   naproxen (NAPROSYN) 500 MG tablet Take by mouth as needed.    Omega-3 1000 MG CAPS Take 2,000 mg by mouth 2 (two) times daily.   sodium chloride (OCEAN) 0.65 % nasal spray Place 2 sprays into the nose daily as needed. For nasal congestion   Turmeric 400 MG CAPS Take 400 mg by mouth in the morning and at bedtime.   metoprolol tartrate (LOPRESSOR) 25 MG tablet Take 25 mg by mouth 2 (two) times daily. (Patient not taking: Reported on 04/01/2021)   No facility-administered encounter medications on file as of 04/01/2021.    Subjective *** Past Medical History:  Diagnosis Date   Arthritis    Chronic back pain    Depression    Fibromyalgia    GERD (gastroesophageal reflux disease)    Hiatal hernia    History of gastritis    Hypertension    followed by pcp   Hypothyroidism    followed by pcp   Mixed hyperlipidemia    SOB (shortness of breath)    per pt gets winded at times   VIN III (vulvar intraepithelial neoplasia III)    Wears glasses     Past Surgical History:  Procedure Laterality Date   ANTERIOR CERVICAL DECOMP/DISCECTOMY FUSION  09-21-2005  @[facility]    C5--7   CARPAL TUNNEL RELEASE Bilateral 2006    HEMORROIDECTOMY  2004   LAMINECTOMY AND MICRODISCECTOMY LUMBAR SPINE  06-05-2011  @[facility]    L2--3   LAPAROSCOPIC CHOLECYSTECTOMY  2004   LUMBAR FUSION  12-08-2006  @[facility]    L4--5   LUMBAR SPINE HARDWARE REMOVAL  10/02/2009  @[facility]    L4--5   POSTERIOR LUMBAR FUSION  06-08-2012  @[facility]    re-do  L2--3   ULNAR TUNNEL RELEASE Left 2010   VAGINAL HYSTERECTOMY  12-01-2002  @[facility]    VULVECTOMY N/A 07/09/2020   Procedure: WIDE EXCISION VULVECTOMY, BIOPSY OF RIGHT LEG LESION;  Surgeon: 06-10-2012, MD;  Location: [facility] ;  Service: Gynecology;  Laterality: N/A;    OB History     Gravida  8   Para  4   Term      Preterm      AB      Living  4      SAB      IAB      Ectopic      Multiple      Live Births              Allergies  Allergen Reactions   Hydrocodone Other (See Comments)    hyper   Codeine     Hyper, anxious, can't sleep  Ibuprofen Other (See Comments)    GI upset   Morphine     Social History   Socioeconomic History   Marital status: Media planner    Spouse name: Not on file   Number of children: Not on file   Years of education: Not on file   Highest education level: Not on file  Occupational History   Occupation: on disability  Tobacco Use   Smoking status: Former    Packs/day: 1.00    Years: 20.00    Pack years: 20.00    Types: Cigarettes    Quit date: 11/28/2018    Years since quitting: 2.3   Smokeless tobacco: Never  Vaping Use   Vaping Use: Never used  Substance and Sexual Activity   Alcohol use: Yes    Comment: occasional   Drug use: Never   Sexual activity: Yes    Birth control/protection: Surgical  Other Topics Concern   Not on file  Social History Narrative   Not on file   Social Determinants of Health   Financial Resource Strain: Low Risk    Difficulty of Paying Living Expenses: Not very hard  Food Insecurity: Food Insecurity Present   Worried About Running Out of Food in the Last Year: Sometimes  true   Ran Out of Food in the Last Year: Sometimes true  Transportation Needs: No Transportation Needs   Lack of Transportation (Medical): No   Lack of Transportation (Non-Medical): No  Physical Activity: Insufficiently Active   Days of Exercise per Week: 1 day   Minutes of Exercise per Session: 10 min  Stress: Stress Concern Present   Feeling of Stress : To some extent  Social Connections: Moderately Isolated   Frequency of Communication with Friends and Family: Three times a week   Frequency of Social Gatherings with Friends and Family: Once a week   Attends Religious Services: Never   Database administrator or Organizations: No   Attends Engineer, structural: Never   Marital Status: Living with partner    Family History  Problem Relation Age of Onset   Ovarian cancer Mother    Skin cancer Mother    Bladder Cancer Father    Leukemia Sister    Breast cancer Neg Hx    Colon cancer Neg Hx     Medications:       Current Outpatient Medications:    amLODipine (NORVASC) 10 MG tablet, Take 10 mg by mouth daily., Disp: , Rfl:    cimetidine (TAGAMET) 200 MG tablet, Take 200 mg by mouth 2 (two) times daily., Disp: , Rfl:    conjugated estrogens (PREMARIN) vaginal cream, Place 1 Applicatorful vaginally 3 (three) times a week., Disp: 42.5 g, Rfl: 1   EUTHYROX 75 MCG tablet, Take 75 mcg by mouth daily., Disp: , Rfl:    Magnesium 200 MG TABS, Take by mouth in the morning and at bedtime. Per pt takes for nerve pain, Disp: , Rfl:    MILK THISTLE PO, Take by mouth daily. , Disp: , Rfl:    Multiple Vitamin (MULTIVITAMIN) capsule, Take 1 capsule by mouth daily., Disp: , Rfl:    naproxen (NAPROSYN) 500 MG tablet, Take by mouth as needed. , Disp: , Rfl:    Omega-3 1000 MG CAPS, Take 2,000 mg by mouth 2 (two) times daily., Disp: , Rfl:    sodium chloride (OCEAN) 0.65 % nasal spray, Place 2 sprays into the nose daily as needed. For nasal congestion, Disp: , Rfl:  Turmeric 400 MG CAPS,  Take 400 mg by mouth in the morning and at bedtime., Disp: , Rfl:    metoprolol tartrate (LOPRESSOR) 25 MG tablet, Take 25 mg by mouth 2 (two) times daily. (Patient not taking: Reported on 04/01/2021), Disp: , Rfl:   Objective Blood pressure 135/90, pulse 63, height 5\' 6"  (1.676 m), weight 176 lb (79.8 kg).  ***  Pertinent ROS ***  Labs or studies ***    Impression Diagnoses this Encounter:: No diagnosis found.  Established relevant diagnosis(es): ***  Plan/Recommendations: No orders of the defined types were placed in this encounter.   Labs or Scans Ordered: No orders of the defined types were placed in this encounter.   Management:: ***  Follow up No follow-ups on file.     [email or web address]: 10 min     99213: 15 min      99214:25 min}   Face to face time:  *** minutes  Greater than 50% of the visit time was spent in counseling and coordination of care with the patient.  The summary and outline of the counseling and care coordination is summarized in the note above.   All questions were answered.

## 2021-04-10 ENCOUNTER — Encounter (INDEPENDENT_AMBULATORY_CARE_PROVIDER_SITE_OTHER): Payer: Self-pay | Admitting: Gastroenterology

## 2021-04-10 ENCOUNTER — Other Ambulatory Visit: Payer: Self-pay

## 2021-04-10 ENCOUNTER — Ambulatory Visit (INDEPENDENT_AMBULATORY_CARE_PROVIDER_SITE_OTHER): Payer: Medicaid Other | Admitting: Gastroenterology

## 2021-04-10 DIAGNOSIS — R1319 Other dysphagia: Secondary | ICD-10-CM | POA: Diagnosis not present

## 2021-04-10 DIAGNOSIS — K5900 Constipation, unspecified: Secondary | ICD-10-CM | POA: Diagnosis not present

## 2021-04-10 DIAGNOSIS — K219 Gastro-esophageal reflux disease without esophagitis: Secondary | ICD-10-CM

## 2021-04-10 DIAGNOSIS — R1310 Dysphagia, unspecified: Secondary | ICD-10-CM | POA: Insufficient documentation

## 2021-04-10 NOTE — Patient Instructions (Signed)
Schedule EGD with ED and colonoscopy Continue famotidine twice a day Start taking Miralax 1 capful every day for one week. If bowel movements do not improve, increase to 1 capful every 12 hours. If after two weeks there is no improvement, increase to 1 capful every 8 hours

## 2021-04-10 NOTE — Progress Notes (Signed)
Katrinka Blazing, M.D. Gastroenterology & Hepatology [facility] [address] [place], [place] [address] Primary Care Physician: Olena Heckle [address] [place] [address]  Referring MD: PCP  Chief Complaint: Dysphagia  History of Present Illness: [name] is a 55 y.o. female who presents for evaluation of dysphagia.  With past medical history of chronic back pain, depression, fibromyalgia, GERD, hypertension, hypothyroidism, hyperlipidemia, vulvar intraepithelial neoplasia status post resection, coming for evaluation of dysphagia.  States for the last couple of years she has presented intermittent episodes of dysphagia to solids. Liquids actually help to swallow food and she does not have any problem swallowing this.  She has tried eating smaller pieces but sometimes she needs to hyper extend her neck so the food will eventually go down. No food impaction or food regurgitation in the past. Has noticed that after drinking alcohol her dysphagia gets worse. No odynophagia.  The patient also endorses having a history of GERD for multiple years.  Currently she takes famotidine 20 mg twice a day which suppreses her symptoms, but also she avoids food that triggers symptoms.  Denies having any recent episodes of heartburn.  Notably, the patient has intentionally lost weight with diet modifications and she believes this has helped her GERD.  Occasionally has blood in stool as she has to strain, usually she has 1 bowel movement per day. Does not take laxatives frequently but she reports that sometimes she has to do manual maneuvers to remove the stool.  States that she mostly manages her constipation with diet.   Takes Aleve occasionally but mostly magnesium and turmeric. previously on opiates for 10 years but stopped 8 years.  The patient denies having any nausea, vomiting, fever, chills, melena, hematemesis,  abdominal distention, abdominal pain, diarrhea, jaundice, pruritus.  Last EGD:20 years ago, reports she had duodenitis Last Colonoscopy:>10 years ago, diverticulosis per patient  FHx: An aunt and an uncle had esophageal diseases that require dilation in the past, otherwise neg for any gastrointestinal/liver disease, father bladder ca, mom uterus Social: Former smoking but quit 2 years ago, used to drink frequently but states that has decided to quit drinking since last week, denies illicit drug use Surgical: nhemorrhoidectomy, hysterectomy, choly.  Past Medical History: Past Medical History:  Diagnosis Date   Arthritis    Chronic back pain    Depression    Fibromyalgia    GERD (gastroesophageal reflux disease)    Hiatal hernia    History of gastritis    Hypertension    followed by pcp   Hypothyroidism    followed by pcp   Mixed hyperlipidemia    SOB (shortness of breath)    per pt gets winded at times   VIN III (vulvar intraepithelial neoplasia III)    Wears glasses     Past Surgical History: Past Surgical History:  Procedure Laterality Date   ANTERIOR CERVICAL DECOMP/DISCECTOMY FUSION  09-21-2005  @[facility]    C5--7   CARPAL TUNNEL RELEASE Bilateral 2006   HEMORROIDECTOMY  2004   LAMINECTOMY AND MICRODISCECTOMY LUMBAR SPINE  06-05-2011  @[facility]    L2--3   LAPAROSCOPIC CHOLECYSTECTOMY  2004   LUMBAR FUSION  12-08-2006  @[facility]    L4--5   LUMBAR SPINE HARDWARE REMOVAL  10/02/2009  @[facility]    L4--5   PARTIAL HYSTERECTOMY     POSTERIOR LUMBAR FUSION  06-08-2012  @[facility]    re-do  L2--3   ULNAR TUNNEL RELEASE Left 2010   VAGINAL  HYSTERECTOMY  12-01-2002  @[facility]    VULVECTOMY N/A 07/09/2020   Procedure: WIDE EXCISION VULVECTOMY, BIOPSY OF RIGHT LEG LESION;  Surgeon: 07/11/2020, MD;  Location: [facility];  Service: Gynecology;  Laterality: N/A;    Family History: Family History  Problem Relation Age of Onset   Ovarian cancer Mother    Skin cancer Mother    Bladder Cancer  Father    Leukemia Sister    Breast cancer Neg Hx    Colon cancer Neg Hx     Social History: Social History   Tobacco Use  Smoking Status Former   Packs/day: 1.00   Years: 20.00   Pack years: 20.00   Types: Cigarettes   Quit date: 11/28/2018   Years since quitting: 2.3  Smokeless Tobacco Never   Social History   Substance and Sexual Activity  Alcohol Use Yes   Comment: occasional   Social History   Substance and Sexual Activity  Drug Use Never    Allergies: Allergies  Allergen Reactions   Hydrocodone Other (See Comments)    hyper   Codeine     Hyper, anxious, can't sleep   Ibuprofen Other (See Comments)    GI upset   Morphine     Medications: Current Outpatient Medications  Medication Sig Dispense Refill   amLODipine (NORVASC) 10 MG tablet Take 10 mg by mouth daily.     conjugated estrogens (PREMARIN) vaginal cream Place 1 Applicatorful vaginally 3 (three) times a week. 42.5 g 1   EUTHYROX 75 MCG tablet Take 75 mcg by mouth daily.     Magnesium 200 MG TABS Take by mouth in the morning and at bedtime. Per pt takes for nerve pain     metoprolol tartrate (LOPRESSOR) 25 MG tablet Take 25 mg by mouth 2 (two) times daily. (Patient not taking: Reported on 04/01/2021)     MILK THISTLE PO Take by mouth daily.      Multiple Vitamin (MULTIVITAMIN) capsule Take 1 capsule by mouth daily.     naproxen (NAPROSYN) 500 MG tablet Take by mouth as needed.      Omega-3 1000 MG CAPS Take 2,000 mg by mouth 2 (two) times daily.     sodium chloride (OCEAN) 0.65 % nasal spray Place 2 sprays into the nose daily as needed. For nasal congestion     Turmeric 400 MG CAPS Take 400 mg by mouth in the morning and at bedtime.     No current facility-administered medications for this visit.    Review of Systems: GENERAL: negative for malaise, night sweats HEENT: No changes in hearing or vision, no nose bleeds or other nasal problems. NECK: Negative for lumps, goiter, pain and significant  neck swelling RESPIRATORY: Negative for cough, wheezing CARDIOVASCULAR: Negative for chest pain, leg swelling, palpitations, orthopnea GI: SEE HPI MUSCULOSKELETAL: Negative for joint pain or swelling, back pain, and muscle pain. SKIN: Negative for lesions, rash PSYCH: Negative for sleep disturbance, mood disorder and recent psychosocial stressors. HEMATOLOGY Negative for prolonged bleeding, bruising easily, and swollen nodes. ENDOCRINE: Negative for cold or heat intolerance, polyuria, polydipsia and goiter. NEURO: negative for tremor, gait imbalance, syncope and seizures. The remainder of the review of systems is noncontributory.   Physical Exam: BP 131/83 (BP Location: Left Arm, Patient Position: Sitting, Cuff Size: Large)   Pulse (!) 57   Temp 97.8 F (36.6 C) (Oral)   Ht 5\' 6"  (1.676 m)   Wt 176 lb 14.4 oz (80.2 kg)   BMI 28.55  kg/m  GENERAL: The patient is AO x3, in no acute distress. HEENT: Head is normocephalic and atraumatic. EOMI are intact. Mouth is well hydrated and without lesions. NECK: Supple. No masses LUNGS: Clear to auscultation. No presence of rhonchi/wheezing/rales. Adequate chest expansion HEART: RRR, normal s1 and s2. ABDOMEN: Soft, nontender, no guarding, no peritoneal signs, and nondistended. BS +. No masses. EXTREMITIES: Without any cyanosis, clubbing, rash, lesions or edema. NEUROLOGIC: AOx3, no focal motor deficit. SKIN: no jaundice, no rashes   Imaging/Labs: as above  I personally reviewed and interpreted the available labs, imaging and endoscopic files.  Impression and Plan: [name] is a 55 y.o. female who presents for evaluation of dysphagia.  With past medical history of chronic back pain, depression, fibromyalgia, GERD, hypertension, hypothyroidism, hyperlipidemia, vulvar intraepithelial neoplasia status post resection, coming for evaluation of dysphagia.  The patient has presented chronic episodes of dysphagia of unclear etiology.  It is  possible that these are related to peptic strictures as she has a history of GERD that has been managed with anti-H2 medication only.  We will need to proceed with an EGD to evaluate this further, the patient understood and agreed.  For now, she can continue taking famotidine at the same doses twice a day.  She is due for colorectal cancer screening, a colonoscopy will be scheduled for the same day of her esophagogastroduodenospy.  Finally, given her straining when having bowel movements, she was advised to take MiraLAX to improve her constipation.  Patient understood and agreed.  -Schedule EGD with ED and colonoscopy -Continue famotidine twice a day -Start taking Miralax 1 capful every day for one week. If bowel movements do not improve, increase to 1 capful every 12 hours. If after two weeks there is no improvement, increase to 1 capful every 8 hours  All questions were answered.      Katrinka Blazing, MD Gastroenterology and Hepatology [facility]

## 2021-04-17 ENCOUNTER — Telehealth (INDEPENDENT_AMBULATORY_CARE_PROVIDER_SITE_OTHER): Payer: Self-pay

## 2021-04-17 ENCOUNTER — Other Ambulatory Visit (INDEPENDENT_AMBULATORY_CARE_PROVIDER_SITE_OTHER): Payer: Self-pay

## 2021-04-17 MED ORDER — PEG 3350-KCL-NA BICARB-NACL 420 G PO SOLR: 4000.0000 mL | ORAL | 0 refills | Status: AC

## 2021-04-17 NOTE — Telephone Encounter (Signed)
[name], CMA  

## 2021-04-18 ENCOUNTER — Encounter (INDEPENDENT_AMBULATORY_CARE_PROVIDER_SITE_OTHER): Payer: Self-pay

## 2021-05-16 ENCOUNTER — Ambulatory Visit (HOSPITAL_COMMUNITY): Admission: RE | Admit: 2021-05-16 | Payer: Medicaid Other | Source: Home / Self Care | Admitting: Gastroenterology

## 2021-05-16 ENCOUNTER — Encounter (HOSPITAL_COMMUNITY): Admission: RE | Payer: Self-pay | Source: Home / Self Care

## 2021-05-16 SURGERY — ESOPHAGOGASTRODUODENOSCOPY (EGD) WITH PROPOFOL
Anesthesia: Monitor Anesthesia Care

## 2021-10-07 ENCOUNTER — Other Ambulatory Visit: Payer: Self-pay

## 2021-10-07 ENCOUNTER — Encounter: Payer: Self-pay | Admitting: Obstetrics & Gynecology

## 2021-10-07 ENCOUNTER — Ambulatory Visit: Payer: Medicaid Other | Admitting: Obstetrics & Gynecology

## 2021-10-07 VITALS — BP 138/89 | HR 69 | Ht 66.0 in | Wt 177.0 lb

## 2021-10-07 DIAGNOSIS — Z1231 Encounter for screening mammogram for malignant neoplasm of breast: Secondary | ICD-10-CM | POA: Diagnosis not present

## 2021-10-07 DIAGNOSIS — D071 Carcinoma in situ of vulva: Secondary | ICD-10-CM

## 2021-10-07 NOTE — Progress Notes (Signed)
Chief Complaint  Patient presents with   Follow-up    Severe vulvar dysplasia    lump in left breast      56 y.o. G8P4 No LMP recorded. Patient has had a hysterectomy. The current method of family planning is status post hysterectomy.  Outpatient Encounter Medications as of 10/07/2021  Medication Sig   amLODipine (NORVASC) 5 MG tablet Take 5 mg by mouth daily.   conjugated estrogens (PREMARIN) vaginal cream Place 1 Applicatorful vaginally 3 (three) times a week. (Patient taking differently: Place 1 Applicatorful vaginally once a week.)   EUTHYROX 75 MCG tablet Take 75 mcg by mouth daily.   famotidine (PEPCID) 20 MG tablet Take 20 mg by mouth 2 (two) times daily.   Magnesium 200 MG TABS Take 200 mg by mouth daily. Per pt takes for nerve pain   methocarbamol (ROBAXIN) 500 MG tablet Take 500 mg by mouth 2 (two) times daily as needed.   metoprolol tartrate (LOPRESSOR) 25 MG tablet Take 25 mg by mouth 2 (two) times daily.   milk thistle 175 MG tablet Take 175 mg by mouth 2 (two) times daily.   Multiple Vitamin (MULTIVITAMIN) capsule Take 1 capsule by mouth daily. Gummie   NAPROXEN PO Take 440 mg by mouth daily as needed for headache.   Omega-3 1000 MG CAPS Take 2,000 mg by mouth 2 (two) times daily.   polyethylene glycol-electrolytes (TRILYTE) 420 g solution Take 4,000 mLs by mouth as directed.   sodium chloride (OCEAN) 0.65 % nasal spray Place 2 sprays into the nose daily as needed for congestion. For nasal congestion   Tetrahydroz-Polyvinyl Al-Povid (EYE IRRITATION RELIEF) 0.05-0.5-0.6 % SOLN Place 1 drop into both eyes daily as needed (itching eyes). Saline   TURMERIC PO Take 1,500 mg by mouth daily at 6 (six) AM.   No facility-administered encounter medications on file as of 10/07/2021.    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  Denese Killings, GYN oncology and she underwent resection in November 2021 The pathology returned as high-grade dysplasia, VIN 3 No evidence of invasive carcinoma Positive margin at the clitoris which is not surprising given where it was involved This was a microscopic margin and not a gross margin  She is here today for follow-up to be scheduled every 6 months for maybe 2 years and then hopefully yearly There was an area that she had some concern about on the left vulva No real symptoms associated just an area she felt  Past Medical History:  Diagnosis Date   Arthritis    Chronic back pain    Depression    Fibromyalgia    GERD (gastroesophageal reflux disease)    Hiatal hernia    History of gastritis    Hypertension    followed by pcp   Hypothyroidism    followed by pcp   Mixed hyperlipidemia    SOB (shortness of breath)    per pt gets winded at times   VIN III (vulvar intraepithelial neoplasia III)    Wears glasses     Past Surgical History:  Procedure Laterality Date   ANTERIOR CERVICAL DECOMP/DISCECTOMY FUSION  09-21-2005  @[facility]    C5--7   CARPAL TUNNEL RELEASE Bilateral 2006   HEMORROIDECTOMY  2004   LAMINECTOMY AND MICRODISCECTOMY LUMBAR SPINE  06-05-2011  @[facility]    L2--3   LAPAROSCOPIC CHOLECYSTECTOMY  2004   LUMBAR FUSION  12-08-2006  @[facility]    L4--5   LUMBAR SPINE HARDWARE REMOVAL  10/02/2009  @[facility]    L4--5   PARTIAL HYSTERECTOMY     POSTERIOR LUMBAR FUSION  06-08-2012  @[facility]    re-do  L2--3   ULNAR TUNNEL RELEASE Left 2010   VAGINAL HYSTERECTOMY  12-01-2002  @[facility]    VULVECTOMY N/A 07/09/2020   Procedure: WIDE EXCISION VULVECTOMY, BIOPSY OF RIGHT LEG LESION;  Surgeon: 2011, MD;  Location: [facility];  Service: Gynecology;  Laterality: N/A;    OB History     Gravida  8   Para  4   Term      Preterm      AB      Living  4      SAB      IAB      Ectopic      Multiple      Live Births              Allergies   Allergen Reactions   Hydrocodone Other (See Comments)    hyper   Codeine     Hyper, anxious, can't sleep   Ibuprofen Other (See Comments)    GI upset   Lisinopril Cough    Sore throat   Morphine Itching    Awake    Social History   Socioeconomic History   Marital status:    Spouse name: Not on file   Number of children: Not on file   Years of education: Not on file   Highest education level: Not on file  Occupational History   Occupation: on disability  Tobacco Use   Smoking status: Former    Packs/day: 1.00    Years: 20.00    Pack years: 20.00    Types: Cigarettes    Quit date: 11/28/2018    Years since quitting: 2.8   Smokeless tobacco: Never  Vaping Use   Vaping Use: Never used  Substance and Sexual Activity   Alcohol use: Yes    Comment: occasional   Drug use: Never   Sexual activity: Yes    Birth control/protection: Surgical  Other Topics Concern   Not on file  Social History Narrative   Not on file   Social Determinants of Health   Financial Resource Strain: Not on file  Food Insecurity: Not on file  Transportation Needs: Not on file  Physical Activity: Not on file  Stress: Not on file  Social Connections: Not on file    Family History  Problem Relation Age of Onset   Ovarian cancer Mother    Skin cancer Mother    Bladder Cancer Father    Leukemia Sister    Breast cancer Neg Hx    Colon cancer Neg Hx     Medications:       Current Outpatient Medications:    amLODipine (NORVASC) 5 MG tablet, Take 5 mg by mouth daily., Disp: , Rfl:    conjugated estrogens (PREMARIN) vaginal cream, Place 1 Applicatorful vaginally 3 (three) times a week. (Patient taking differently: Place 1 Applicatorful vaginally once a week.), Disp: 42.5 g, Rfl: 1   EUTHYROX 75 MCG tablet, Take 75 mcg by mouth daily., Disp: , Rfl:    famotidine (PEPCID) 20 MG tablet, Take 20 mg by mouth 2 (two) times daily., Disp: , Rfl:    Magnesium 200 MG TABS, Take 200 mg  by mouth daily. Per pt takes for nerve pain,  Disp: , Rfl:    methocarbamol (ROBAXIN) 500 MG tablet, Take 500 mg by mouth 2 (two) times daily as needed., Disp: , Rfl:    metoprolol tartrate (LOPRESSOR) 25 MG tablet, Take 25 mg by mouth 2 (two) times daily., Disp: , Rfl:    milk thistle 175 MG tablet, Take 175 mg by mouth 2 (two) times daily., Disp: , Rfl:    Multiple Vitamin (MULTIVITAMIN) capsule, Take 1 capsule by mouth daily. Gummie, Disp: , Rfl:    NAPROXEN PO, Take 440 mg by mouth daily as needed for headache., Disp: , Rfl:    Omega-3 1000 MG CAPS, Take 2,000 mg by mouth 2 (two) times daily., Disp: , Rfl:    polyethylene glycol-electrolytes (TRILYTE) 420 g solution, Take 4,000 mLs by mouth as directed., Disp: 4000 mL, Rfl: 0   sodium chloride (OCEAN) 0.65 % nasal spray, Place 2 sprays into the nose daily as needed for congestion. For nasal congestion, Disp: , Rfl:    Tetrahydroz-Polyvinyl Al-Povid (EYE IRRITATION RELIEF) 0.05-0.5-0.6 % SOLN, Place 1 drop into both eyes daily as needed (itching eyes). Saline, Disp: , Rfl:    TURMERIC PO, Take 1,500 mg by mouth daily at 6 (six) AM., Disp: , Rfl:   Objective Blood pressure 138/89, pulse 69, height 5\' 6"  (1.676 m), weight 177 lb (80.3 kg).  Gen WDWN NAD Left breast is normal no masses, what the patient is concerned about is chest wall, her breast is less dense and medially less tissue so she is feeling underlying chest wall On exam her surgical site is well-healed There are no areas on the left or right vulva that which are concerning for dysplasia There is no erythema or leukoplakia The area around the clitoris is also free of any obvious disease  Today her exam is completely normal without any evidence of persistence or recurrence  Pertinent ROS No burning with urination, frequency or urgency No nausea, vomiting or diarrhea Nor fever chills or other constitutional symptoms  Labs or studies Laboratory data from her biopsy I performed  as well as her definitive surgical therapy were reviewed    Impression Diagnoses this Encounter::   ICD-10-CM   1. Breast cancer screening by mammogram  Z12.31 MM Digital Screening    2. Severe vulvar dysplasia, histologically confirmed, S/P resection b Dr Pricilla Holm 07/09/20  D07.1    NED       Established relevant diagnosis(es):   Plan/Recommendations: No orders of the defined types were placed in this encounter.   Labs or Scans Ordered: Orders Placed This Encounter  Procedures   MM Digital Screening    Management:: [name] will be seen back here in the office in 6 months for ongoing surveillance Should she have any issues in the meantime she will certainly let us know No further intervention or therapy is indicated at this time   Follow up Return in about 6 months (around 04/06/2022) for Follow up, with Dr Despina Hidden.      All questions were answered.

## 2021-10-22 ENCOUNTER — Ambulatory Visit (HOSPITAL_COMMUNITY)
Admission: RE | Admit: 2021-10-22 | Discharge: 2021-10-22 | Disposition: A | Discharge status: Home / Self Care | Payer: Medicaid Other | Source: Ambulatory Visit | Attending: Obstetrics & Gynecology | Admitting: Obstetrics & Gynecology

## 2021-10-22 ENCOUNTER — Other Ambulatory Visit: Payer: Self-pay

## 2021-10-22 DIAGNOSIS — Z1231 Encounter for screening mammogram for malignant neoplasm of breast: Secondary | ICD-10-CM | POA: Diagnosis present

## 2021-11-13 ENCOUNTER — Ambulatory Visit (HOSPITAL_COMMUNITY): Payer: Medicaid Other | Attending: Neurosurgery

## 2021-11-13 DIAGNOSIS — G8929 Other chronic pain: Secondary | ICD-10-CM | POA: Insufficient documentation

## 2021-11-13 DIAGNOSIS — M5442 Lumbago with sciatica, left side: Secondary | ICD-10-CM | POA: Diagnosis present

## 2021-11-13 DIAGNOSIS — R2689 Other abnormalities of gait and mobility: Secondary | ICD-10-CM | POA: Diagnosis present

## 2021-11-13 DIAGNOSIS — M5431 Sciatica, right side: Secondary | ICD-10-CM | POA: Diagnosis present

## 2021-11-13 NOTE — Therapy (Signed)
OUTPATIENT PHYSICAL THERAPY THORACOLUMBAR EVALUATION   Patient Name: [name] MRN: [id number] DOB:01-Jul-1966, 56 y.o., female Today's Date: 11/13/2021   PT End of Session - 11/13/21 1434     Visit Number 1    Number of Visits 9    Date for PT Re-Evaluation 12/12/21    Authorization Type [place] [employer]    Authorization - Visit Number 1    Progress Note Due on Visit 9    PT Start Time 1428    PT Stop Time 1520    PT Time Calculation (min) 52 min             Past Medical History:  Diagnosis Date   Arthritis    Chronic back pain    Depression    Fibromyalgia    GERD (gastroesophageal reflux disease)    Hiatal hernia    History of gastritis    Hypertension    followed by pcp   Hypothyroidism    followed by pcp   Mixed hyperlipidemia    SOB (shortness of breath)    per pt gets winded at times   VIN III (vulvar intraepithelial neoplasia III)    Wears glasses    Past Surgical History:  Procedure Laterality Date   ANTERIOR CERVICAL DECOMP/DISCECTOMY FUSION  09-21-2005  @[facility]    C5--7   CARPAL TUNNEL RELEASE Bilateral 2006   HEMORROIDECTOMY  2004   LAMINECTOMY AND MICRODISCECTOMY LUMBAR SPINE  06-05-2011  @[facility]    L2--3   LAPAROSCOPIC CHOLECYSTECTOMY  2004   LUMBAR FUSION  12-08-2006  @[facility]    L4--5   LUMBAR SPINE HARDWARE REMOVAL  10/02/2009  @[facility]    L4--5   PARTIAL HYSTERECTOMY     POSTERIOR LUMBAR FUSION  06-08-2012  @[facility]    re-do  L2--3   ULNAR TUNNEL RELEASE Left 2010   VAGINAL HYSTERECTOMY  12-01-2002  @[facility]    VULVECTOMY N/A 07/09/2020   Procedure: WIDE EXCISION VULVECTOMY, BIOPSY OF RIGHT LEG LESION;  Surgeon: Carver Fila, MD;  Location: [facility];  Service: Gynecology;  Laterality: N/A;   Patient Active Problem List   Diagnosis Date Noted   GERD (gastroesophageal reflux disease) 04/10/2021   Dysphagia 04/10/2021   Constipation 04/10/2021   VIN III (vulvar intraepithelial neoplasia III) 06/24/2020   Chronic back pain      PCP: Tylene Fantasia., PA-C  REFERRING PROVIDER: Donalee Citrin, MD  REFERRING DIAG: PT eval/tx for M54.40 lum,bago of lumbar region w/sciatica per Donalee Citrin, MD   THERAPY DIAG:  Chronic left-sided low back pain with left-sided sciatica  Sciatica, right side  Other abnormalities of gait and mobility  ONSET DATE: Feb 2023; new symptoms  SUBJECTIVE:  SUBJECTIVE STATEMENT: Hx of back pain, 4 lumbar surgeries and 1 cervical surgical.  2013 discectomy.  "Adhesive arachnoiditis".  She reports new symptoms started in 09/2021 with pain and numbness and tingling Right leg. Has an episode where she could not walk for 2 days.   PERTINENT HISTORY:  4 lumbar surgeries, 1 cervical surgery, multiple spinal injections, last MRI 5 years ago.   PAIN:  Are you having pain? Yes: NPRS scale: 6/10 Pain location: low back and down into the left foot; numbness/tingling and burning into left leg Pain description: tightness in low back, burning, numbness and tingling into the legs Aggravating factors: standing, sitting too long, walking for a long distance Relieving factors: resting, laying   PRECAUTIONS: None  WEIGHT BEARING RESTRICTIONS No  FALLS:  Has patient fallen in last 6 months? No  LIVING ENVIRONMENT: Lives with: lives with their family and lives with their partner Lives in: House/apartment Stairs: Yes: Internal: 12 steps; none and External: 2 steps; none Has following equipment at home: None  OCCUPATION: disability  PLOF:  back pain limited function somewhat; but independent with dressing , bathing; no pain in Right leg with walking  PATIENT GOALS updated exercises, less pain   OBJECTIVE:   DIAGNOSTIC FINDINGS:  Last MRI 5 years ago  SCREENING FOR RED FLAGS: Bowel or bladder incontinence:  No Spinal tumors: No Cauda equina syndrome: No Compression fracture: No Abdominal aneurysm: No  COGNITION:  Overall cognitive status: Within functional limits for tasks assessed     SENSATION: Light touch: Impaired   POSTURE:  "Flat back"; decreased lordotic curve  PALPATION: Tender and tight all Lumbar spine paraspinals  LUMBAR ROM:   Active  A/PROM  11/13/2021 % available  Flexion 70%  Extension 20%  Right lateral flexion 75%  Left lateral flexion 50%  Right rotation 50%  Left rotation 50%   (Blank rows = not tested)   LE MMT:  MMT Right 11/13/2021 Left 11/13/2021  Hip flexion 4 4-  Hip extension    Hip abduction 4- 4-  Hip adduction 4 4  Hip internal rotation    Hip external rotation    Knee flexion 4 4-  Knee extension 4 4-  Ankle dorsiflexion 4+ 4+  Ankle plantarflexion    Ankle inversion    Ankle eversion     (Blank rows = not tested)   FUNCTIONAL TESTS:  5 times sit to stand: 22 sec Timed up and go (TUG): 14 sec  GAIT: Distance walked: 20 ft Assistive device utilized: None Level of assistance: SBA Comments: shuffling gait pattern; slow gait speed    TODAY'S TREATMENT  Physical therapy evaluation, HEP instruction, plan of care   PATIENT EDUCATION:  Education details: HEP, plan of care Person educated: Patient Education method: Explanation, Demonstration, Verbal cues, and Handouts Education comprehension: verbalized understanding, returned demonstration, verbal cues required, and needs further education   HOME EXERCISE PROGRAM: Exercises - Supine Transversus Abdominis Bracing - Hands on Stomach  - 1 x daily - 7 x weekly - 1 sets - 10 reps - 5 sec hold - Supine Lower Trunk Rotation  - 1 x daily - 7 x weekly - 1 sets - 10 reps - 2-3 sec hold - Supine Hip Adduction Isometric with Ball  - 1 x daily - 7 x weekly - 1 sets - 10 reps - 5 sec hold  ASSESSMENT:  CLINICAL IMPRESSION: Patient is a 56 y.o. female who was seen today for physical  therapy evaluation and treatment for low  back pain with sciatica. She has hx of multiple spinal surgeries but a couple months ago had some new symptoms that included some radiating pain into her Right leg and Right hip and groin area pain.  Patient saw Dr. Wynetta Emery who referred her to outpatient physical therapy. Will consider a MRI if no improvements seen.    OBJECTIVE IMPAIRMENTS Abnormal gait, decreased activity tolerance, decreased balance, decreased coordination, decreased endurance, decreased mobility, difficulty walking, decreased ROM, decreased strength, hypomobility, impaired perceived functional ability, improper body mechanics, postural dysfunction, and pain.   ACTIVITY LIMITATIONS cleaning, community activity, driving, meal prep, laundry, yard work, and shopping.   PERSONAL FACTORS Fitness and Past/current experiences are also affecting patient's functional outcome.    REHAB POTENTIAL: Good  CLINICAL DECISION MAKING: Stable/uncomplicated  EVALUATION COMPLEXITY: Low   GOALS: Goals reviewed with patient? No  SHORT TERM GOALS: Target date: 11/27/2021   Patient with be independent with initial HEP to  improve functional outcomes Baseline: Goal status: INITIAL  2.  Patient will improve 5 x STS score from 22 sec to 20 sec to demonstrate improved functional mobility and increased lower extremity strength.  Baseline: 22 sec Goal status: INITIAL  LONG TERM GOALS: Target date: 12/11/2021  : Patient will be independent with advanced HEP and self management strategies to improve quality of life and functional outcomes.  Goal status: INITIAL  2.  Patient will report at least 50% improvement in overall symptoms and/or function to demonstrate improved functional mobility  Goal status: INITIAL  3.  Patient will improve TUG score from 14 sec to 11 sec or better to demonstrate improved functional mobility and decreased fall risk.  Baseline: 14 sec Goal status: INITIAL  4.  Patient  will improve all Lower extremity MMT's measured to 4+/5 or better to improve patients efficiency with navigating steps in home.  Baseline: LE MMT:  MMT Right 11/13/2021 Left 11/13/2021  Hip flexion 4 4-  Hip extension    Hip abduction 4- 4-  Hip adduction 4 4  Hip internal rotation    Hip external rotation    Knee flexion 4 4-  Knee extension 4 4-  Ankle dorsiflexion 4+ 4+  Ankle plantarflexion    Ankle inversion    Ankle eversion     (Blank rows = not tested) Goal status: INITIAL    PLAN: PT FREQUENCY: 2x/week  PT DURATION: 4 weeks  PLANNED INTERVENTIONS: Therapeutic exercises, Therapeutic activity, Neuromuscular re-education, Balance training, Gait training, Patient/Family education, Joint manipulation, Joint mobilization, Stair training, Vestibular training, Aquatic Therapy, Dry Needling, Electrical stimulation, Spinal manipulation, Spinal mobilization, Cryotherapy, Moist heat, scar mobilization, Taping, Traction, Ultrasound, Contrast bath, Biofeedback, and Manual therapy.  PLAN FOR NEXT SESSION: Review HEP and goals, progress lumbar stabilization and core strengthening as able.    3:40 PM, 11/13/21 Eyal Greenhaw Small Brynja Marker MPT [facility] [place] [phone]

## 2021-12-09 ENCOUNTER — Encounter (HOSPITAL_COMMUNITY): Payer: Self-pay

## 2021-12-09 ENCOUNTER — Ambulatory Visit (HOSPITAL_COMMUNITY): Payer: Medicaid Other | Attending: Neurosurgery

## 2021-12-09 DIAGNOSIS — G8929 Other chronic pain: Secondary | ICD-10-CM | POA: Insufficient documentation

## 2021-12-09 DIAGNOSIS — M5442 Lumbago with sciatica, left side: Secondary | ICD-10-CM | POA: Diagnosis present

## 2021-12-09 DIAGNOSIS — R2689 Other abnormalities of gait and mobility: Secondary | ICD-10-CM | POA: Insufficient documentation

## 2021-12-09 DIAGNOSIS — M5431 Sciatica, right side: Secondary | ICD-10-CM | POA: Insufficient documentation

## 2021-12-09 NOTE — Therapy (Signed)
?OUTPATIENT PHYSICAL THERAPY TREATMENT NOTE ? ? ?Patient Name: [name] ?MRN: [id number] ?DOB:03/03/1966, 56 y.o., female ?Today's Date: 12/09/2021 ? ?PCP: Raiford Simmonds., PA-C  ?REFERRING PROVIDER: Kary Kos, MD  ? ?END OF SESSION:  ? PT End of Session - 12/09/21 1117   ? ? Visit Number 2   ? Number of Visits 9   ? Date for PT Re-Evaluation 12/12/21   ? Authorization Type [place] Medicaid [employer] - approved 8 visits 4/7 to 12/13/21   ? Authorization - Visit Number 2   ? Authorization - Number of Visits 8   ? Progress Note Due on Visit 9   ? PT Start Time 1118   ? PT Stop Time [id number]   ? PT Time Calculation (min) 40 min   ? ?  ?  ? ?  ? ? ?Past Medical History:  ?Diagnosis Date  ? Arthritis   ? Chronic back pain   ? Depression   ? Fibromyalgia   ? GERD (gastroesophageal reflux disease)   ? Hiatal hernia   ? History of gastritis   ? Hypertension   ? followed by pcp  ? Hypothyroidism   ? followed by pcp  ? Mixed hyperlipidemia   ? SOB (shortness of breath)   ? per pt gets winded at times  ? VIN III (vulvar intraepithelial neoplasia III)   ? Wears glasses   ? ?Past Surgical History:  ?Procedure Laterality Date  ? ANTERIOR CERVICAL DECOMP/DISCECTOMY FUSION  09-21-2005  @[facility]   ? C5--7  ? CARPAL TUNNEL RELEASE Bilateral 2006  ? HEMORROIDECTOMY  2004  ? LAMINECTOMY AND MICRODISCECTOMY LUMBAR SPINE  06-05-2011  @[facility]   ? L2--3  ? LAPAROSCOPIC CHOLECYSTECTOMY  2004  ? LUMBAR FUSION  12-08-2006  @[facility]   ? L4--5  ? LUMBAR SPINE HARDWARE REMOVAL  10/02/2009  @[facility]   ? L4--5  ? PARTIAL HYSTERECTOMY    ? POSTERIOR LUMBAR FUSION  06-08-2012  @[facility]   ? re-do  L2--3  ? ULNAR TUNNEL RELEASE Left 2010  ? VAGINAL HYSTERECTOMY  12-01-2002  @[facility]   ? VULVECTOMY N/A 07/09/2020  ? Procedure: WIDE EXCISION VULVECTOMY, BIOPSY OF RIGHT LEG LESION;  Surgeon: Lafonda Mosses, MD;  Location: [facility];  Service: Gynecology;  Laterality: N/A;  ? ?Patient Active Problem List  ? Diagnosis Date Noted  ? GERD (gastroesophageal reflux  disease) 04/10/2021  ? Dysphagia 04/10/2021  ? Constipation 04/10/2021  ? VIN III (vulvar intraepithelial neoplasia III) 06/24/2020  ? Chronic back pain   ? ? ?REFERRING DIAG: PT eval/tx for M54.40 lum,bago of lumbar region w/sciatica per Kary Kos, MD   ? ?THERAPY DIAG:  ?Chronic left-sided low back pain with left-sided sciatica ? ?Sciatica, right side ? ?Other abnormalities of gait and mobility ? ?PERTINENT HISTORY: 4 lumbar surgeries, 1 cervical surgery, multiple spinal injections, last MRI 5 years ago.   ? ?PRECAUTIONS: None  ? ?SUBJECTIVE: Patient reports she was able to get into HEP well, feels sore but good. Filled her pool and is overall motivated "to feel healthier". States awareness of prior abdominal irritation, points to domming at superior abdominal center region. States interest in [place], and stretching "V sit into folding forward into elbows" that allows relief of feet symptoms. ? ?PAIN:  ?Are you having pain? Yes: NPRS scale: 5-6/10 ?Pain location: lower back, B but R>L, recent pain pattern change increased R  ?Pain description: constant, tight, can "lock up" "squeeze tight"  ?Aggravating factors: movement, prolonged sitting ?Relieving factors: 15-20 min laying  down, a good night sleep ? ? ?OBJECTIVE: (objective measures completed at initial evaluation unless otherwise dated) ?Italics from evaluation on 11/13/21 ?  ?DIAGNOSTIC FINDINGS:  ?Last MRI 5 years ago ?  ?SCREENING FOR RED FLAGS: ?Bowel or bladder incontinence: No ?Spinal tumors: No ?Cauda equina syndrome: No ?Compression fracture: No ?Abdominal aneurysm: No ?  ?COGNITION: ?          Overall cognitive status: Within functional limits for tasks assessed               ?           ?SENSATION: ?Light touch: Impaired  ?  ?POSTURE:  ?"Flat back"; decreased lordotic curve ?  ?PALPATION: ?Tender and tight all Lumbar spine paraspinals ?  ?LUMBAR ROM:  ?  ?Active  A/PROM  ?11/13/2021 ?% available  ?Flexion 70%  ?Extension 20%  ?Right lateral flexion 75%   ?Left lateral flexion 50%  ?Right rotation 50%  ?Left rotation 50%  ? (Blank rows = not tested) ?  ?  ?LE MMT: ?  ?MMT Right ?11/13/2021 Left ?11/13/2021  ?Hip flexion 4 4-  ?Hip extension      ?Hip abduction 4- 4-  ?Hip adduction 4 4  ?Hip internal rotation      ?Hip external rotation      ?Knee flexion 4 4-  ?Knee extension 4 4-  ?Ankle dorsiflexion 4+ 4+  ?Ankle plantarflexion      ?Ankle inversion      ?Ankle eversion      ? (Blank rows = not tested) ?  ?  ?FUNCTIONAL TESTS:  ?5 times sit to stand: 22 sec ?Timed up and go (TUG): 14 sec ?  ?GAIT: ?Distance walked: 20 ft ?Assistive device utilized: None ?Level of assistance: SBA ?Comments: shuffling gait pattern; slow gait speed ?  ?  ?  ?TODAY'S TREATMENT  ?12/09/21 =  Review goals and HEP ? There-Ex =  ?Supine for HEP review =   all cued for continued breathing/counting ?- Supine Transversus Abdominis Bracing - Hands on Stomach  -  1 sets - 10 reps - 5 sec hold ?- Supine Lower Trunk Rotation  - 1 sets - 10 reps - 2-3 sec hold ?- Supine Hip Adduction Isometric with Ball  - 1 sets - 10 reps - 5 sec hold ?Supine for abdominal activation increase = observed palpable doming 3 inch wide at superior central abdominal wall ? - Supine core activation brace with addition of marching - 2 sets - 10 reps cueing for breathing  ?Neuro-Re-ed = education conversation as below ? Supine = SLR sciatic nerve glide 2 x 30 seconds B - L leg glide pulsing ankle DF/PF; R leg preferred hold tension ankle DF ? ?11/13/21 = Physical therapy evaluation, HEP instruction, plan of care ?  ?  ?PATIENT EDUCATION:  ?Education details: 11/13/21 - HEP, plan of care  12/09/21 - core activation, breath control, abdominal muscle layers, neurodynamics and nerve tension stretching  ?Person educated: Patient ?Education method: Explanation, Demonstration, Verbal cues, and Handouts ?Education comprehension: verbalized understanding, returned demonstration, verbal cues required, and needs further education ?  ?  ?HOME  EXERCISE PROGRAM: ?Exercises ?- Supine Transversus Abdominis Bracing - Hands on Stomach  - 1 x daily - 7 x weekly - 1 sets - 10 reps - 5 sec hold ?- Supine Lower Trunk Rotation  - 1 x daily - 7 x weekly - 1 sets - 10 reps - 2-3 sec hold ?- Supine Hip Adduction Isometric with Diona Foley  -  1 x daily - 7 x weekly - 1 sets - 10 reps - 5 sec hold ?  ?ASSESSMENT: ?  ?CLINICAL IMPRESSION: ?12/09/21 - Patient is a 56 y.o. female who was seen today for first full treatment session with three week delay secondary to scheduling.  Focus for today's appointment was first review of goals and HEP with patient preforming with overall good form, however difficulty with breathing throughout.   During discussion, she reports that she naturally improved with nerve tensioning activities, thus further nerve tensioning stretching initiated today with education to support as well.  Suman demonstrating fair core isolation however affected by central doming and needs continued input for stabilization. New activities trialed today, not given for home yet as will be back tomorrow for next session. If patient tolerated treatment well, add more HEP.  Patient is a good candidate to continue with skilled physical therapy to progress core stabilization and improve current functional abilities.  ?  ?OBJECTIVE IMPAIRMENTS Abnormal gait, decreased activity tolerance, decreased balance, decreased coordination, decreased endurance, decreased mobility, difficulty walking, decreased ROM, decreased strength, hypomobility, impaired perceived functional ability, improper body mechanics, postural dysfunction, and pain.  ?  ?ACTIVITY LIMITATIONS cleaning, community activity, driving, meal prep, laundry, yard work, and shopping.  ?  ?PERSONAL FACTORS Fitness and Past/current experiences are also affecting patient's functional outcome.  ?  ?  ?REHAB POTENTIAL: Good ?  ?CLINICAL DECISION MAKING: Stable/uncomplicated ?  ?EVALUATION COMPLEXITY: Low ?  ?  ?GOALS: ?Goals  reviewed with patient? YES - 12/09/21 ?  ?SHORT TERM GOALS: Target date: 11/27/2021 ?  ? Patient with be independent with initial HEP to  improve functional outcomes ?Baseline: ?Goal status: INITIAL ?  ?2.  Fraser Din

## 2021-12-10 ENCOUNTER — Encounter (HOSPITAL_COMMUNITY): Payer: Self-pay | Admitting: Physical Therapy

## 2021-12-10 ENCOUNTER — Ambulatory Visit (HOSPITAL_COMMUNITY): Payer: Medicaid Other | Admitting: Physical Therapy

## 2021-12-10 DIAGNOSIS — M5431 Sciatica, right side: Secondary | ICD-10-CM

## 2021-12-10 DIAGNOSIS — G8929 Other chronic pain: Secondary | ICD-10-CM

## 2021-12-10 DIAGNOSIS — R2689 Other abnormalities of gait and mobility: Secondary | ICD-10-CM

## 2021-12-10 DIAGNOSIS — M5442 Lumbago with sciatica, left side: Secondary | ICD-10-CM | POA: Diagnosis not present

## 2021-12-10 NOTE — Therapy (Signed)
?OUTPATIENT PHYSICAL THERAPY TREATMENT NOTE ? ? ?Patient Name: [name] ?MRN: [id number] ?DOB:[birth year], 56 y.o., female ?Today's Date: 12/10/2021 ? ?PCP: Raiford Simmonds., PA-C  ?REFERRING PROVIDER: Kary Kos, MD  ?Progress Note ?Reporting Period 11/13/21 to 12/10/21 ? ?See note below for Objective Data and Assessment of Progress/Goals.  ? ? ? ?END OF SESSION:  ? PT End of Session - 12/10/21 1306   ? ? Visit Number 3   ? Number of Visits 11   ? Date for PT Re-Evaluation 01/07/22   ? Authorization Type [place] [employer]   ? Authorization Time Period Submitted for 8 more visits 12/10/21 (check auth)   ? Authorization - Visit Number 3   ? Authorization - Number of Visits 8   ? Progress Note Due on Visit 11   ? PT Start Time 1304   ? PT Stop Time 1342   ? PT Time Calculation (min) 38 min   ? Activity Tolerance Patient tolerated treatment well   ? Behavior During Therapy [facility] for tasks assessed/performed   ? ?  ?  ? ?  ? ? ?Past Medical History:  ?Diagnosis Date  ? Arthritis   ? Chronic back pain   ? Depression   ? Fibromyalgia   ? GERD (gastroesophageal reflux disease)   ? Hiatal hernia   ? History of gastritis   ? Hypertension   ? followed by pcp  ? Hypothyroidism   ? followed by pcp  ? Mixed hyperlipidemia   ? SOB (shortness of breath)   ? per pt gets winded at times  ? VIN III (vulvar intraepithelial neoplasia III)   ? Wears glasses   ? ?Past Surgical History:  ?Procedure Laterality Date  ? ANTERIOR CERVICAL DECOMP/DISCECTOMY FUSION  09-21-2005  _0   ? C5--7  ? CARPAL TUNNEL RELEASE Bilateral 2006  ? HEMORROIDECTOMY  2004  ? LAMINECTOMY AND MICRODISCECTOMY LUMBAR SPINE  06-05-2011  _1   ? L2--3  ? LAPAROSCOPIC CHOLECYSTECTOMY  2004  ? LUMBAR FUSION  12-08-2006  _2   ? L4--5  ? LUMBAR SPINE HARDWARE REMOVAL  10/02/2009  _3   ? L4--5  ? PARTIAL HYSTERECTOMY    ? POSTERIOR LUMBAR FUSION  06-08-2012  _4   ? re-do  L2--3  ? ULNAR TUNNEL RELEASE Left 2010  ? VAGINAL HYSTERECTOMY  12-01-2002  _5   ? VULVECTOMY N/A  07/09/2020  ? Procedure: WIDE EXCISION VULVECTOMY, BIOPSY OF RIGHT LEG LESION;  Surgeon: Lafonda Mosses, MD;  Location: [facility];  Service: Gynecology;  Laterality: N/A;  ? ?Patient Active Problem List  ? Diagnosis Date Noted  ? GERD (gastroesophageal reflux disease) 04/10/2021  ? Dysphagia 04/10/2021  ? Constipation 04/10/2021  ? VIN III (vulvar intraepithelial neoplasia III) 06/24/2020  ? Chronic back pain   ? ? ?REFERRING DIAG: PT eval/tx for M54.40 lum,bago of lumbar region w/sciatica per Kary Kos, MD   ? ?THERAPY DIAG:  ?Chronic left-sided low back pain with left-sided sciatica ? ?Sciatica, right side ? ?Other abnormalities of gait and mobility ? ?PERTINENT HISTORY: 4 lumbar surgeries, 1 cervical surgery, multiple spinal injections, last MRI 5 years ago.   ? ?PRECAUTIONS: None  ? ?SUBJECTIVE: Things are going fine. Feels legs are weaker in recent years. She is planning to get more active.  ? ?PAIN:  ?Are you having pain? Yes: NPRS scale: 5-6/10 ?Pain location: lower back, B but R>L, recent pain pattern change increased R  ?Pain description: constant, tight, can "lock up" "squeeze tight"  ?Aggravating factors:  movement, prolonged sitting ?Relieving factors: 15-20 min laying down, a good night sleep ? ? ?OBJECTIVE: (objective measures completed at initial evaluation unless otherwise dated) ?Italics from evaluation on 11/13/21 ?  ?DIAGNOSTIC FINDINGS:  ?Last MRI 5 years ago ?  ?SCREENING FOR RED FLAGS: ?Bowel or bladder incontinence: No ?Spinal tumors: No ?Cauda equina syndrome: No ?Compression fracture: No ?Abdominal aneurysm: No ?  ?COGNITION: ?          Overall cognitive status: Within functional limits for tasks assessed               ?           ?SENSATION: ?Light touch: Impaired  ?  ?POSTURE:  ?"Flat back"; decreased lordotic curve ?  ?PALPATION: ?Tender and tight all Lumbar spine paraspinals ?  ?LUMBAR ROM:  ?  ?Active  A/PROM  ?11/13/2021 ?% available A/PROM  ?12/10/2021 ?% available   ?Flexion 70% 100%  ?Extension 20% 40%  ?Right lateral flexion 75% 75%  ?Left lateral flexion 50% 75%  ?Right rotation 50% 100%  ?Left rotation 50% 85%  ? (Blank rows = not tested) ?  ?  ?LE MMT: ?  ?MMT Right ?11/13/2021 Right ?12/10/2021 Left ?11/13/2021 Left ?12/10/2021  ?Hip flexion 4 5 4- 4  ?Hip extension   4-   4-  ?Hip abduction 4- 4 4- 4  ?Hip adduction 4  4   ?Hip internal rotation        ?Hip external rotation        ?Knee flexion 4  4-   ?Knee extension 4 5 4- 4  ?Ankle dorsiflexion 4+  4+   ?Ankle plantarflexion        ?Ankle inversion        ?Ankle eversion        ? (Blank rows = not tested) ?  ?  ?FUNCTIONAL TESTS:  ?5 times sit to stand: 19.08 sec ?Timed up and go (TUG): 14 sec ?  ?  ?TODAY'S TREATMENT  ?12/10/21 ?Reassess:  ?Ab brace 15 x5" ?   Ab march x20 ?   Glute set 10 x 5" ?   Clamshell x10 each  ? Sciatic nerve glides x15 LLE ? ? ?12/09/21 =  Review goals and HEP ? There-Ex =  ?Supine for HEP review =   all cued for continued breathing/counting ?- Supine Transversus Abdominis Bracing - Hands on Stomach  -  1 sets - 10 reps - 5 sec hold ?- Supine Lower Trunk Rotation  - 1 sets - 10 reps - 2-3 sec hold ?- Supine Hip Adduction Isometric with Ball  - 1 sets - 10 reps - 5 sec hold ?Supine for abdominal activation increase = observed palpable doming 3 inch wide at superior central abdominal wall ? - Supine core activation brace with addition of marching - 2 sets - 10 reps cueing for breathing  ?Neuro-Re-ed = education conversation as below ? Supine = SLR sciatic nerve glide 2 x 30 seconds B - L leg glide pulsing ankle DF/PF; R leg preferred hold tension ankle DF ? ?11/13/21 = Physical therapy evaluation, HEP instruction, plan of care ?  ?  ?PATIENT EDUCATION:  ?Education details: 11/13/21 - HEP, plan of care  12/09/21 - core activation, breath control, abdominal muscle layers, neurodynamics and nerve tension stretching  ?Person educated: Patient ?Education method: Explanation, Demonstration, Verbal cues, and  Handouts ?Education comprehension: verbalized understanding, returned demonstration, verbal cues required, and needs further education ?  ?  ?HOME EXERCISE PROGRAM: ?Exercises ?-  Supine Transversus Abdominis Bracing - Hands on Stomach  - 1 x daily - 7 x weekly - 1 sets - 10 reps - 5 sec hold ?- Supine Lower Trunk Rotation  - 1 x daily - 7 x weekly - 1 sets - 10 reps - 2-3 sec hold ?- Supine Hip Adduction Isometric with Ball  - 1 x daily - 7 x weekly - 1 sets - 10 reps - 5 sec hold ? 12/10/21 ?Ab march ?Glute set ?Clamshell ?Nerve glides  ? ?ASSESSMENT: ?  ?CLINICAL IMPRESSION: ?Patient showing steady progress to therapy goals. Showing some improvement in strength and lumbar mobility. Patient continues to be limited by pain and glute weakness which continues to negatively impact functional ability. Patient will continue to benefit from skilled therapy services to address remaining deficits for reduced back pain and improve LOF with ADLs. ?  ?OBJECTIVE IMPAIRMENTS Abnormal gait, decreased activity tolerance, decreased balance, decreased coordination, decreased endurance, decreased mobility, difficulty walking, decreased ROM, decreased strength, hypomobility, impaired perceived functional ability, improper body mechanics, postural dysfunction, and pain.  ?  ?ACTIVITY LIMITATIONS cleaning, community activity, driving, meal prep, laundry, yard work, and shopping.  ?  ?PERSONAL FACTORS Fitness and Past/current experiences are also affecting patient's functional outcome.  ?  ?  ?REHAB POTENTIAL: Good ?  ?CLINICAL DECISION MAKING: Stable/uncomplicated ?  ?EVALUATION COMPLEXITY: Low ?  ?  ?GOALS: ?Goals reviewed with patient? YES - 12/09/21 ?  ?SHORT TERM GOALS: Target date: 11/27/2021 ?  ? Patient with be independent with initial HEP to  improve functional outcomes ?Baseline: Reports compliance ?Goal status: MET ?  ?2.  Patient will improve 5 x STS score from 22 sec to 20 sec to demonstrate improved functional mobility and  increased lower extremity strength. ?  ?Baseline: 19 sec ?Goal status: MET ?  ?LONG TERM GOALS: Target date: 12/11/2021 ?  ?: Patient will be independent with advanced HEP and self management strategies to improve qual

## 2021-12-15 ENCOUNTER — Ambulatory Visit (HOSPITAL_COMMUNITY): Payer: Medicaid Other

## 2021-12-15 DIAGNOSIS — R2689 Other abnormalities of gait and mobility: Secondary | ICD-10-CM

## 2021-12-15 DIAGNOSIS — M5442 Lumbago with sciatica, left side: Secondary | ICD-10-CM | POA: Diagnosis not present

## 2021-12-15 DIAGNOSIS — G8929 Other chronic pain: Secondary | ICD-10-CM

## 2021-12-15 DIAGNOSIS — M5431 Sciatica, right side: Secondary | ICD-10-CM

## 2021-12-15 NOTE — Therapy (Signed)
OUTPATIENT PHYSICAL THERAPY TREATMENT NOTE   Patient Name: SALIHAH KACER MRN: [id number] DOB:[birth year], 56 y.o., female Today's Date: 12/15/2021  PCP: Kizzie Furnish D., PA-C  REFERRING PROVIDER: Donalee Citrin, MD      END OF SESSION:   PT End of Session - 12/15/21 1305     Visit Number 4    Number of Visits 11    Date for PT Re-Evaluation 01/07/22    Authorization Type [place] Medicaid [employer] Blue    Authorization Time Period Submitted for 8 more visits 12/10/21 (check auth)    Authorization - Visit Number 4    Authorization - Number of Visits 8    Progress Note Due on Visit 11    PT Start Time 1304    PT Stop Time 1344    PT Time Calculation (min) 40 min    Activity Tolerance Patient tolerated treatment well    Behavior During Therapy WFL for tasks assessed/performed              Past Medical History:  Diagnosis Date   Arthritis    Chronic back pain    Depression    Fibromyalgia    GERD (gastroesophageal reflux disease)    Hiatal hernia    History of gastritis    Hypertension    followed by pcp   Hypothyroidism    followed by pcp   Mixed hyperlipidemia    SOB (shortness of breath)    per pt gets winded at times   VIN III (vulvar intraepithelial neoplasia III)    Wears glasses    Past Surgical History:  Procedure Laterality Date   ANTERIOR CERVICAL DECOMP/DISCECTOMY FUSION  09-21-2005  @[facility]    C5--7   CARPAL TUNNEL RELEASE Bilateral 2006   HEMORROIDECTOMY  2004   LAMINECTOMY AND MICRODISCECTOMY LUMBAR SPINE  06-05-2011  @[facility]    L2--3   LAPAROSCOPIC CHOLECYSTECTOMY  2004   LUMBAR FUSION  12-08-2006  @[facility]    L4--5   LUMBAR SPINE HARDWARE REMOVAL  10/02/2009  @[facility]    L4--5   PARTIAL HYSTERECTOMY     POSTERIOR LUMBAR FUSION  06-08-2012  @[facility]    re-do  L2--3   ULNAR TUNNEL RELEASE Left 2010   VAGINAL HYSTERECTOMY  12-01-2002  @[facility]    VULVECTOMY N/A 07/09/2020   Procedure: WIDE EXCISION VULVECTOMY, BIOPSY OF RIGHT LEG LESION;  Surgeon: Carver Fila, MD;   Location: [facility];  Service: Gynecology;  Laterality: N/A;   Patient Active Problem List   Diagnosis Date Noted   GERD (gastroesophageal reflux disease) 04/10/2021   Dysphagia 04/10/2021   Constipation 04/10/2021   VIN III (vulvar intraepithelial neoplasia III) 06/24/2020   Chronic back pain     REFERRING DIAG: PT eval/tx for M54.40 lum,bago of lumbar region w/sciatica per Donalee Citrin, MD    THERAPY DIAG:  Chronic left-sided low back pain with left-sided sciatica  Sciatica, right side  Other abnormalities of gait and mobility  PERTINENT HISTORY: 4 lumbar surgeries, 1 cervical surgery, multiple spinal injections, last MRI 5 years ago.    PRECAUTIONS: None   SUBJECTIVE: Patient reports pain levels staying about the same but can tell she is stronger.  Has been compliant with HEP.   PAIN:  Are you having pain? Yes: NPRS scale: 5/10 Pain location: lower back, B but R>L, recent pain pattern change increased R  Pain description: constant, tight, can "lock up" "squeeze tight"  Aggravating factors: movement, prolonged sitting Relieving factors: 15-20 min laying down, a good night sleep  OBJECTIVE: (objective measures completed at initial evaluation unless otherwise dated) Italics from evaluation on 11/13/21   DIAGNOSTIC FINDINGS:  Last MRI 5 years ago   SCREENING FOR RED FLAGS: Bowel or bladder incontinence: No Spinal tumors: No Cauda equina syndrome: No Compression fracture: No Abdominal aneurysm: No   COGNITION:           Overall cognitive status: Within functional limits for tasks assessed                          SENSATION: Light touch: Impaired    POSTURE:  "Flat back"; decreased lordotic curve   PALPATION: Tender and tight all Lumbar spine paraspinals   LUMBAR ROM:    Active  A/PROM  11/13/2021 % available A/PROM  12/10/2021 % available  Flexion 70% 100%  Extension 20% 40%  Right lateral flexion 75% 75%  Left lateral flexion 50% 75%   Right rotation 50% 100%  Left rotation 50% 85%   (Blank rows = not tested)     LE MMT:   MMT Right 11/13/2021 Right 12/10/2021 Left 11/13/2021 Left 12/10/2021  Hip flexion 4 5 4- 4  Hip extension   4-   4-  Hip abduction 4- 4 4- 4  Hip adduction 4  4   Hip internal rotation        Hip external rotation        Knee flexion 4  4-   Knee extension 4 5 4- 4  Ankle dorsiflexion 4+  4+   Ankle plantarflexion        Ankle inversion        Ankle eversion         (Blank rows = not tested)     FUNCTIONAL TESTS:  5 times sit to stand: 19.08 sec Timed up and go (TUG): 14 sec     TODAY'S TREATMENT  12/15/21 Abdominal bracing 5' hold 2 x 10 Abdominal bracing with marching  x 20 Abdominal bracing 5" with ball hip Adduction x 20 Abdominal bracing 5" with belt hip Abduction x 20 Glute sets 5" hold x 20 Sciatic nerve glides x15 LLE Bridge x 10  Clamshell x 15 each     12/10/21 Reassess:  Ab brace 15 x5"    Ab march x20    Glute set 10 x 5"    Clamshell x10 each   Sciatic nerve glides x15 LLE   12/09/21 =  Review goals and HEP  There-Ex =  Supine for HEP review =   all cued for continued breathing/counting - Supine Transversus Abdominis Bracing - Hands on Stomach  -  1 sets - 10 reps - 5 sec hold - Supine Lower Trunk Rotation  - 1 sets - 10 reps - 2-3 sec hold - Supine Hip Adduction Isometric with Ball  - 1 sets - 10 reps - 5 sec hold Supine for abdominal activation increase = observed palpable doming 3 inch wide at superior central abdominal wall  - Supine core activation brace with addition of marching - 2 sets - 10 reps cueing for breathing  Neuro-Re-ed = education conversation as below  Supine = SLR sciatic nerve glide 2 x 30 seconds B - L leg glide pulsing ankle DF/PF; R leg preferred hold tension ankle DF  11/13/21 = Physical therapy evaluation, HEP instruction, plan of care     PATIENT EDUCATION:  Education details: 11/13/21 - HEP, plan of care  12/09/21 - core activation,  breath control,  abdominal muscle layers, neurodynamics and nerve tension stretching  Person educated: Patient Education method: Explanation, Demonstration, Verbal cues, and Handouts Education comprehension: verbalized understanding, returned demonstration, verbal cues required, and needs further education     HOME EXERCISE PROGRAM: Exercises - Supine Transversus Abdominis Bracing - Hands on Stomach  - 1 x daily - 7 x weekly - 1 sets - 10 reps - 5 sec hold - Supine Lower Trunk Rotation  - 1 x daily - 7 x weekly - 1 sets - 10 reps - 2-3 sec hold - Supine Hip Adduction Isometric with Ball  - 1 x daily - 7 x weekly - 1 sets - 10 reps - 5 sec hold  12/10/21 Ab march Glute set Clamshell Nerve glides   ASSESSMENT:   CLINICAL IMPRESSION: Today's session focused on continuing lumbar and core strengthening. Added bridging to HEP in increase glute, low back and hamstring strength. She needs reminders to breath appropriately during exercise.  Patient will continue to benefit from skilled therapy services to address remaining deficits for reduced back pain and improve LOF with ADLs.   OBJECTIVE IMPAIRMENTS Abnormal gait, decreased activity tolerance, decreased balance, decreased coordination, decreased endurance, decreased mobility, difficulty walking, decreased ROM, decreased strength, hypomobility, impaired perceived functional ability, improper body mechanics, postural dysfunction, and pain.    ACTIVITY LIMITATIONS cleaning, community activity, driving, meal prep, laundry, yard work, and shopping.    PERSONAL FACTORS Fitness and Past/current experiences are also affecting patient's functional outcome.      REHAB POTENTIAL: Good   CLINICAL DECISION MAKING: Stable/uncomplicated   EVALUATION COMPLEXITY: Low     GOALS: Goals reviewed with patient? YES - 12/09/21   SHORT TERM GOALS: Target date: 11/27/2021    Patient with be independent with initial HEP to  improve functional outcomes Baseline:  Reports compliance Goal status: MET   2.  Patient will improve 5 x STS score from 22 sec to 20 sec to demonstrate improved functional mobility and increased lower extremity strength.   Baseline: 19 sec Goal status: MET   LONG TERM GOALS: Target date: 12/11/2021   : Patient will be independent with advanced HEP and self management strategies to improve quality of life and functional outcomes.   Goal status: Ongoing   2.  Patient will report at least 50% improvement in overall symptoms and/or function to demonstrate improved functional mobility   Goal status: MET (Reports 50%)   3.  Patient will improve TUG score from 14 sec to 11 sec or better to demonstrate improved functional mobility and decreased fall risk.  Baseline: 10 sec Goal status: MET   4.  Patient will improve all Lower extremity MMT's measured to 4+/5 or better to improve patients efficiency with navigating steps in home.  Baseline: See MMT Goal status: Partially MET       PLAN: PT FREQUENCY: 2x/week   PT DURATION: 4 weeks   PLANNED INTERVENTIONS: Therapeutic exercises, Therapeutic activity, Neuromuscular re-education, Balance training, Gait training, Patient/Family education, Joint manipulation, Joint mobilization, Stair training, Vestibular training, Aquatic Therapy, Dry Needling, Electrical stimulation, Spinal manipulation, Spinal mobilization, Cryotherapy, Moist heat, scar mobilization, Taping, Traction, Ultrasound, Contrast bath, Biofeedback, and Manual therapy.   PLAN FOR NEXT SESSION:  continue to build core stabilization, lumbar strengthening, and nerve opening. Try standing exercise as able; hip vectors, scapular retractions  1:45 PM, 12/15/21 Naylah Cork Small Eilish Mcdaniel MPT [facility] [place] [phone] Ph:[phone]

## 2021-12-17 ENCOUNTER — Ambulatory Visit (HOSPITAL_COMMUNITY): Payer: Medicaid Other | Admitting: Physical Therapy

## 2021-12-17 ENCOUNTER — Encounter (HOSPITAL_COMMUNITY): Payer: Self-pay | Admitting: Physical Therapy

## 2021-12-17 DIAGNOSIS — G8929 Other chronic pain: Secondary | ICD-10-CM

## 2021-12-17 DIAGNOSIS — M5431 Sciatica, right side: Secondary | ICD-10-CM

## 2021-12-17 DIAGNOSIS — M5442 Lumbago with sciatica, left side: Secondary | ICD-10-CM | POA: Diagnosis not present

## 2021-12-17 DIAGNOSIS — R2689 Other abnormalities of gait and mobility: Secondary | ICD-10-CM

## 2021-12-17 NOTE — Therapy (Signed)
?OUTPATIENT PHYSICAL THERAPY TREATMENT NOTE ? ? ?Patient Name: [name] ?MRN: [id number] ?DOB:11-13-1965, 56 y.o., female ?Today's Date: 12/17/2021 ? ?PCP: Tylene Fantasia., PA-C  ?REFERRING PROVIDER: Donalee Citrin, MD  ? ? ? ? ?END OF SESSION:  ? PT End of Session - 12/17/21 1605   ? ? Visit Number 5   ? Number of Visits 11   ? Date for PT Re-Evaluation 01/07/22   ? Authorization Type [place] [employer]   ? Authorization Time Period Approved 4 visits 5/8-01/13/22   ? Authorization - Visit Number 2   ? Authorization - Number of Visits 4   ? Progress Note Due on Visit 11   ? PT Start Time 1605   ? PT Stop Time 1644   ? PT Time Calculation (min) 39 min   ? Activity Tolerance Patient tolerated treatment well   ? Behavior During Therapy [facility] for tasks assessed/performed   ? ?  ?  ? ?  ? ? ? ?Past Medical History:  ?Diagnosis Date  ? Arthritis   ? Chronic back pain   ? Depression   ? Fibromyalgia   ? GERD (gastroesophageal reflux disease)   ? Hiatal hernia   ? History of gastritis   ? Hypertension   ? followed by pcp  ? Hypothyroidism   ? followed by pcp  ? Mixed hyperlipidemia   ? SOB (shortness of breath)   ? per pt gets winded at times  ? VIN III (vulvar intraepithelial neoplasia III)   ? Wears glasses   ? ?Past Surgical History:  ?Procedure Laterality Date  ? ANTERIOR CERVICAL DECOMP/DISCECTOMY FUSION  09-21-2005  @[facility]   ? C5--7  ? CARPAL TUNNEL RELEASE Bilateral 2006  ? HEMORROIDECTOMY  2004  ? LAMINECTOMY AND MICRODISCECTOMY LUMBAR SPINE  06-05-2011  @[facility]   ? L2--3  ? LAPAROSCOPIC CHOLECYSTECTOMY  2004  ? LUMBAR FUSION  12-08-2006  @[facility]   ? L4--5  ? LUMBAR SPINE HARDWARE REMOVAL  10/02/2009  @[facility]   ? L4--5  ? PARTIAL HYSTERECTOMY    ? POSTERIOR LUMBAR FUSION  06-08-2012  @[facility]   ? re-do  L2--3  ? ULNAR TUNNEL RELEASE Left 2010  ? VAGINAL HYSTERECTOMY  12-01-2002  @[facility]   ? VULVECTOMY N/A 07/09/2020  ? Procedure: WIDE EXCISION VULVECTOMY, BIOPSY OF RIGHT LEG LESION;  Surgeon: 06-10-2012, MD;  Location: [facility];  Service: Gynecology;  Laterality: N/A;  ? ?Patient Active Problem List  ? Diagnosis Date Noted  ? GERD (gastroesophageal reflux disease) 04/10/2021  ? Dysphagia 04/10/2021  ? Constipation 04/10/2021  ? VIN III (vulvar intraepithelial neoplasia III) 06/24/2020  ? Chronic back pain   ? ? ?REFERRING DIAG: PT eval/tx for M54.40 lum,bago of lumbar region w/sciatica per Carver Fila, MD   ? ?THERAPY DIAG:  ?Chronic left-sided low back pain with left-sided sciatica ? ?Sciatica, right side ? ?Other abnormalities of gait and mobility ? ?PERTINENT HISTORY: 4 lumbar surgeries, 1 cervical surgery, multiple spinal injections, last MRI 5 years ago.   ? ?PRECAUTIONS: None  ? ?SUBJECTIVE: Patient says she has been having leg cramps due to running out of magnesium. Her back is ok. She has been doing HEP without issues.  ? ?PAIN:  ?Are you having pain? Yes: NPRS scale: 5/10 ?Pain location: lower back, B but R>L, recent pain pattern change increased R  ?Pain description: constant, tight, can "lock up" "squeeze tight"  ?Aggravating factors: movement, prolonged sitting ?Relieving factors: 15-20 min laying down, a good night sleep ? ? ?  OBJECTIVE: (objective measures completed at initial evaluation unless otherwise dated) ?Italics from evaluation on 11/13/21 ?  ?DIAGNOSTIC FINDINGS:  ?Last MRI 5 years ago ?  ?SCREENING FOR RED FLAGS: ?Bowel or bladder incontinence: No ?Spinal tumors: No ?Cauda equina syndrome: No ?Compression fracture: No ?Abdominal aneurysm: No ?  ?COGNITION: ?          Overall cognitive status: Within functional limits for tasks assessed               ?           ?SENSATION: ?Light touch: Impaired  ?  ?POSTURE:  ?"Flat back"; decreased lordotic curve ?  ?PALPATION: ?Tender and tight all Lumbar spine paraspinals ?  ?LUMBAR ROM:  ?  ?Active  A/PROM  ?11/13/2021 ?% available A/PROM  ?12/10/2021 ?% available  ?Flexion 70% 100%  ?Extension 20% 40%  ?Right lateral flexion 75% 75%  ?Left lateral flexion 50% 75%  ?Right  rotation 50% 100%  ?Left rotation 50% 85%  ? (Blank rows = not tested) ?  ?  ?LE MMT: ?  ?MMT Right ?11/13/2021 Right ?12/10/2021 Left ?11/13/2021 Left ?12/10/2021  ?Hip flexion 4 5 4- 4  ?Hip extension   4-   4-  ?Hip abduction 4- 4 4- 4  ?Hip adduction 4  4   ?Hip internal rotation        ?Hip external rotation        ?Knee flexion 4  4-   ?Knee extension 4 5 4- 4  ?Ankle dorsiflexion 4+  4+   ?Ankle plantarflexion        ?Ankle inversion        ?Ankle eversion        ? (Blank rows = not tested) ?  ?  ?FUNCTIONAL TESTS:  ?5 times sit to stand: 19.08 sec ?Timed up and go (TUG): 14 sec ?  ?  ?TODAY'S TREATMENT  ?12/17/21 ?Ab march 3 x 10 ?Dead bug 2 x 10  ?Bridge 2 x10 ?Quadruped hip extension 2 x 10  ?SLR with ab brace 2 x 10 each  ?Birddog x 15 each  ? ?12/15/21 ?Abdominal bracing 5' hold 2 x 10 ?Abdominal bracing with marching  x 20 ?Abdominal bracing 5" with ball hip Adduction x 20 ?Abdominal bracing 5" with belt hip Abduction x 20 ?Glute sets 5" hold x 20 ?Sciatic nerve glides x15 LLE ?Bridge x 10  ?Clamshell x 15 each ? ? ?12/10/21 ?Reassess:  ?Ab brace 15 x5" ?   Ab march x20 ?   Glute set 10 x 5" ?   Clamshell x10 each  ? Sciatic nerve glides x15 LLE ? ? ?12/09/21 =  Review goals and HEP ? There-Ex =  ?Supine for HEP review =   all cued for continued breathing/counting ?- Supine Transversus Abdominis Bracing - Hands on Stomach  -  1 sets - 10 reps - 5 sec hold ?- Supine Lower Trunk Rotation  - 1 sets - 10 reps - 2-3 sec hold ?- Supine Hip Adduction Isometric with Ball  - 1 sets - 10 reps - 5 sec hold ?Supine for abdominal activation increase = observed palpable doming 3 inch wide at superior central abdominal wall ? - Supine core activation brace with addition of marching - 2 sets - 10 reps cueing for breathing  ?Neuro-Re-ed = education conversation as below ? Supine = SLR sciatic nerve glide 2 x 30 seconds B - L leg glide pulsing ankle DF/PF; R leg preferred hold tension ankle  DF ? ?11/13/21 = Physical therapy evaluation,  HEP instruction, plan of care ?  ?  ?PATIENT EDUCATION:  ?Education details: 11/13/21 - HEP, plan of care  12/09/21 - core activation, breath control, abdominal muscle layers, neurodynamics and nerve tension stretching  ?Person educated: Patient ?Education method: Explanation, Demonstration, Verbal cues, and Handouts ?Education comprehension: verbalized understanding, returned demonstration, verbal cues required, and needs further education ?  ?  ?HOME EXERCISE PROGRAM: ?Jan 07, 2022 ?Dead bug 2 x 10  ?Bridge 2 x10 ?Quadruped hip extension 2 x 10  ?SLR with ab brace 2 x 10 each  ?Birddog x 15 each  ? ?Exercises ?- Supine Transversus Abdominis Bracing - Hands on Stomach  - 1 x daily - 7 x weekly - 1 sets - 10 reps - 5 sec hold ?- Supine Lower Trunk Rotation  - 1 x daily - 7 x weekly - 1 sets - 10 reps - 2-3 sec hold ?- Supine Hip Adduction Isometric with Ball  - 1 x daily - 7 x weekly - 1 sets - 10 reps - 5 sec hold ? 12/10/21 ?Ab march ?Glute set ?Clamshell ?Nerve glides  ? ?ASSESSMENT: ?  ?CLINICAL IMPRESSION: ?Patient tolerated session well today. Progressed core strength with pilates based deadbugs/ birddogs. Educated patient on proper form and function and issued updated HEP handout. Patient will continue to benefit from skilled therapy services to reduce remaining deficits and improve functional ability.  ? ?OBJECTIVE IMPAIRMENTS Abnormal gait, decreased activity tolerance, decreased balance, decreased coordination, decreased endurance, decreased mobility, difficulty walking, decreased ROM, decreased strength, hypomobility, impaired perceived functional ability, improper body mechanics, postural dysfunction, and pain.  ?  ?ACTIVITY LIMITATIONS cleaning, community activity, driving, meal prep, laundry, yard work, and shopping.  ?  ?PERSONAL FACTORS Fitness and Past/current experiences are also affecting patient's functional outcome.  ?  ?  ?REHAB POTENTIAL: Good ?  ?CLINICAL DECISION MAKING: Stable/uncomplicated ?   ?EVALUATION COMPLEXITY: Low ?  ?  ?GOALS: ?Goals reviewed with patient? YES - 12/09/21 ?  ?SHORT TERM GOALS: Target date: 11/27/2021 ?  ? Patient with be independent with initial HEP to  improve functional out

## 2021-12-22 ENCOUNTER — Ambulatory Visit (HOSPITAL_COMMUNITY): Payer: Medicaid Other | Admitting: Physical Therapy

## 2021-12-24 ENCOUNTER — Ambulatory Visit (HOSPITAL_COMMUNITY): Payer: Medicaid Other

## 2021-12-24 DIAGNOSIS — R2689 Other abnormalities of gait and mobility: Secondary | ICD-10-CM

## 2021-12-24 DIAGNOSIS — M5431 Sciatica, right side: Secondary | ICD-10-CM

## 2021-12-24 DIAGNOSIS — G8929 Other chronic pain: Secondary | ICD-10-CM

## 2021-12-24 DIAGNOSIS — M5442 Lumbago with sciatica, left side: Secondary | ICD-10-CM | POA: Diagnosis not present

## 2021-12-24 NOTE — Therapy (Signed)
OUTPATIENT PHYSICAL THERAPY TREATMENT NOTE   Patient Name: [name] MRN: [id number] DOB:Nov 08, 1965, 56 y.o., female Today's Date: 12/24/2021  PCP: Kizzie Furnish D., PA-C  REFERRING PROVIDER: Donalee Citrin, MD      END OF SESSION:   PT End of Session - 12/24/21 1303     Visit Number 6    Number of Visits 11    Date for PT Re-Evaluation 01/07/22    Authorization Type [place] [employer]    Authorization Time Period Approved 4 visits 5/8-01/13/22    Authorization - Visit Number 3    Authorization - Number of Visits 4    Progress Note Due on Visit 11    PT Start Time 1300    PT Stop Time 1343    PT Time Calculation (min) 43 min    Activity Tolerance Patient tolerated treatment well    Behavior During Therapy WFL for tasks assessed/performed               Past Medical History:  Diagnosis Date   Arthritis    Chronic back pain    Depression    Fibromyalgia    GERD (gastroesophageal reflux disease)    Hiatal hernia    History of gastritis    Hypertension    followed by pcp   Hypothyroidism    followed by pcp   Mixed hyperlipidemia    SOB (shortness of breath)    per pt gets winded at times   VIN III (vulvar intraepithelial neoplasia III)    Wears glasses    Past Surgical History:  Procedure Laterality Date   ANTERIOR CERVICAL DECOMP/DISCECTOMY FUSION  09-21-2005  @[facility]    C5--7   CARPAL TUNNEL RELEASE Bilateral 2006   HEMORROIDECTOMY  2004   LAMINECTOMY AND MICRODISCECTOMY LUMBAR SPINE  06-05-2011  @[facility]    L2--3   LAPAROSCOPIC CHOLECYSTECTOMY  2004   LUMBAR FUSION  12-08-2006  @[facility]    L4--5   LUMBAR SPINE HARDWARE REMOVAL  10/02/2009  @[facility]    L4--5   PARTIAL HYSTERECTOMY     POSTERIOR LUMBAR FUSION  06-08-2012  @[facility]    re-do  L2--3   ULNAR TUNNEL RELEASE Left 2010   VAGINAL HYSTERECTOMY  12-01-2002  @[facility]    VULVECTOMY N/A 07/09/2020   Procedure: WIDE EXCISION VULVECTOMY, BIOPSY OF RIGHT LEG LESION;  Surgeon: Carver Fila, MD;  Location: [facility]  [place];  Service: Gynecology;  Laterality: N/A;   Patient Active Problem List   Diagnosis Date Noted   GERD (gastroesophageal reflux disease) 04/10/2021   Dysphagia 04/10/2021   Constipation 04/10/2021   VIN III (vulvar intraepithelial neoplasia III) 06/24/2020   Chronic back pain     REFERRING DIAG: PT eval/tx for M54.40 lum,bago of lumbar region w/sciatica per Donalee Citrin, MD    THERAPY DIAG:  Chronic left-sided low back pain with left-sided sciatica  Sciatica, right side  Other abnormalities of gait and mobility  PERTINENT HISTORY: 4 lumbar surgeries, 1 cervical surgery, multiple spinal injections, last MRI 5 years ago.    PRECAUTIONS: None   SUBJECTIVE: Patient now has her magnesium so leg cramps are now gone but she had some pain in her sacral area so took it easy over the weekend. She is not having any issues with her HEP.  PAIN:  Are you having pain? Yes: NPRS scale: 5/10 Pain location: lower back, B but R>L, recent pain pattern change increased R  Pain description: constant, tight, can "lock up" "squeeze tight"  Aggravating factors: movement, prolonged sitting  Relieving factors: 15-20 min laying down, a good night sleep   OBJECTIVE: (objective measures completed at initial evaluation unless otherwise dated) Italics from evaluation on 11/13/21   DIAGNOSTIC FINDINGS:  Last MRI 5 years ago   SCREENING FOR RED FLAGS: Bowel or bladder incontinence: No Spinal tumors: No Cauda equina syndrome: No Compression fracture: No Abdominal aneurysm: No   COGNITION:           Overall cognitive status: Within functional limits for tasks assessed                          SENSATION: Light touch: Impaired    POSTURE:  "Flat back"; decreased lordotic curve   PALPATION: Tender and tight all Lumbar spine paraspinals   LUMBAR ROM:    Active  A/PROM  11/13/2021 % available A/PROM  12/10/2021 % available  Flexion 70% 100%  Extension 20% 40%  Right lateral flexion  75% 75%  Left lateral flexion 50% 75%  Right rotation 50% 100%  Left rotation 50% 85%   (Blank rows = not tested)     LE MMT:   MMT Right 11/13/2021 Right 12/10/2021 Left 11/13/2021 Left 12/10/2021  Hip flexion 4 5 4- 4  Hip extension   4-   4-  Hip abduction 4- 4 4- 4  Hip adduction 4  4   Hip internal rotation        Hip external rotation        Knee flexion 4  4-   Knee extension 4 5 4- 4  Ankle dorsiflexion 4+  4+   Ankle plantarflexion        Ankle inversion        Ankle eversion         (Blank rows = not tested)     FUNCTIONAL TESTS:  5 times sit to stand: 19.08 sec Timed up and go (TUG): 14 sec     TODAY'S TREATMENT  2022-01-11 Ab 2024-03-21x 10 Dead bug 2 x 10  Bridge 2 x10 Side lying clam 2 x 10 Quadruped hip extension 2 x 10  SLR with ab brace 2 x 10 each  Birddog x 15 each   01/04/2022 Ab 21-Mar-2024x 10 Dead bug 2 x 10  Bridge 2 x10 Quadruped hip extension 2 x 10  SLR with ab brace 2 x 10 each  Birddog x 15 each   12/15/21 Abdominal bracing 5' hold 2 x 10 Abdominal bracing with marching  x 20 Abdominal bracing 5" with ball hip Adduction x 20 Abdominal bracing 5" with belt hip Abduction x 20 Glute sets 5" hold x 20 Sciatic nerve glides x15 LLE Bridge x 10  Clamshell x 15 each   12/10/21 Reassess:  Ab brace 15 x5"    Ab march x20    Glute set 10 x 5"    Clamshell x10 each   Sciatic nerve glides x15 LLE   12/09/21 =  Review goals and HEP  There-Ex =  Supine for HEP review =   all cued for continued breathing/counting - Supine Transversus Abdominis Bracing - Hands on Stomach  -  1 sets - 10 reps - 5 sec hold - Supine Lower Trunk Rotation  - 1 sets - 10 reps - 2-3 sec hold - Supine Hip Adduction Isometric with Ball  - 1 sets - 10 reps - 5 sec hold Supine for abdominal activation increase = observed palpable doming 3 inch wide at  superior central abdominal wall  - Supine core activation brace with addition of marching - 2 sets - 10 reps cueing for  breathing  Neuro-Re-ed = education conversation as below  Supine = SLR sciatic nerve glide 2 x 30 seconds B - L leg glide pulsing ankle DF/PF; R leg preferred hold tension ankle DF  11/13/21 = Physical therapy evaluation, HEP instruction, plan of care     PATIENT EDUCATION:  Education details: 11/13/21 - HEP, plan of care  12/09/21 - core activation, breath control, abdominal muscle layers, neurodynamics and nerve tension stretching  Person educated: Patient Education method: Programmer, multimedia, Demonstration, Verbal cues, and Handouts Education comprehension: verbalized understanding, returned demonstration, verbal cues required, and needs further education     HOME EXERCISE PROGRAM: 2021-12-21 Dead bug 2 x 10  Bridge 2 x10 Quadruped hip extension 2 x 10  SLR with ab brace 2 x 10 each  Birddog x 15 each   Exercises - Supine Transversus Abdominis Bracing - Hands on Stomach  - 1 x daily - 7 x weekly - 1 sets - 10 reps - 5 sec hold - Supine Lower Trunk Rotation  - 1 x daily - 7 x weekly - 1 sets - 10 reps - 2-3 sec hold - Supine Hip Adduction Isometric with Ball  - 1 x daily - 7 x weekly - 1 sets - 10 reps - 5 sec hold  12/10/21 Ab march Glute set Clamshell Nerve glides   ASSESSMENT:   CLINICAL IMPRESSION: Patient with no issues with HEP.  She continues with some limitations with lumbar extension mobility. She needs occasional verbal cues for breathing with exercise.  Patient will continue to benefit from skilled therapy services to reduce remaining deficits and improve functional ability.   OBJECTIVE IMPAIRMENTS Abnormal gait, decreased activity tolerance, decreased balance, decreased coordination, decreased endurance, decreased mobility, difficulty walking, decreased ROM, decreased strength, hypomobility, impaired perceived functional ability, improper body mechanics, postural dysfunction, and pain.    ACTIVITY LIMITATIONS cleaning, community activity, driving, meal prep, laundry, yard work, and  shopping.    PERSONAL FACTORS Fitness and Past/current experiences are also affecting patient's functional outcome.      REHAB POTENTIAL: Good   CLINICAL DECISION MAKING: Stable/uncomplicated   EVALUATION COMPLEXITY: Low     GOALS: Goals reviewed with patient? YES - 12/09/21   SHORT TERM GOALS: Target date: 11/27/2021    Patient with be independent with initial HEP to  improve functional outcomes Baseline: Reports compliance Goal status: MET   2.  Patient will improve 5 x STS score from 22 sec to 20 sec to demonstrate improved functional mobility and increased lower extremity strength.   Baseline: 19 sec Goal status: MET   LONG TERM GOALS: Target date: 12/11/2021   : Patient will be independent with advanced HEP and self management strategies to improve quality of life and functional outcomes.   Goal status: Ongoing   2.  Patient will report at least 50% improvement in overall symptoms and/or function to demonstrate improved functional mobility   Goal status: MET (Reports 50%)   3.  Patient will improve TUG score from 14 sec to 11 sec or better to demonstrate improved functional mobility and decreased fall risk.  Baseline: 10 sec Goal status: MET   4.  Patient will improve all Lower extremity MMT's measured to 4+/5 or better to improve patients efficiency with navigating steps in home.  Baseline: See MMT Goal status: Partially MET       PLAN: PT FREQUENCY:  2x/week   PT DURATION: 4 weeks   PLANNED INTERVENTIONS: Therapeutic exercises, Therapeutic activity, Neuromuscular re-education, Balance training, Gait training, Patient/Family education, Joint manipulation, Joint mobilization, Stair training, Vestibular training, Aquatic Therapy, Dry Needling, Electrical stimulation, Spinal manipulation, Spinal mobilization, Cryotherapy, Moist heat, scar mobilization, Taping, Traction, Ultrasound, Contrast bath, Biofeedback, and Manual therapy.   PLAN FOR NEXT SESSION: TUG;  1 visit  remains on current authorization and will re check next visit and likely discharge ;patient has MD appointment next week  with Dr. Wynetta Emery.    1:43 PM, 12/24/21 Maisyn Nouri Small Navada Osterhout MPT [place] [place] [phone]

## 2021-12-29 ENCOUNTER — Encounter (HOSPITAL_COMMUNITY): Payer: Medicaid Other

## 2021-12-31 ENCOUNTER — Ambulatory Visit (HOSPITAL_COMMUNITY): Payer: Medicaid Other

## 2021-12-31 DIAGNOSIS — M5431 Sciatica, right side: Secondary | ICD-10-CM

## 2021-12-31 DIAGNOSIS — G8929 Other chronic pain: Secondary | ICD-10-CM

## 2021-12-31 DIAGNOSIS — M5442 Lumbago with sciatica, left side: Secondary | ICD-10-CM | POA: Diagnosis not present

## 2021-12-31 DIAGNOSIS — R2689 Other abnormalities of gait and mobility: Secondary | ICD-10-CM

## 2021-12-31 NOTE — Therapy (Signed)
OUTPATIENT PHYSICAL THERAPY DISCHARGE NOTE PHYSICAL THERAPY DISCHARGE SUMMARY  Visits from Start of Care: 7  Current functional level related to goals / functional outcomes: See below   Remaining deficits: See below   Education / Equipment: See below   Patient agrees to discharge. Patient goals were partially met. Patient is being discharged due to maximized rehab potential.     Patient Name: [name] MRN: [id number] DOB:1965/10/08, 56 y.o., female 79 Date: 12/31/2021  PCP: Royce Macadamia D., PA-C  REFERRING PROVIDER: Kary Kos, MD      END OF SESSION:   PT End of Session - 12/31/21 1401     Visit Number 7    Number of Visits 11    Date for PT Re-Evaluation 01/07/22    Authorization Type [employer]    Authorization Time Period Approved 4 visits 5/8-01/13/22    Authorization - Visit Number 4    Authorization - Number of Visits 4    Progress Note Due on Visit 11    PT Start Time 0148    PT Stop Time 0217    PT Time Calculation (min) 29 min    Activity Tolerance Patient tolerated treatment well    Behavior During Therapy WFL for tasks assessed/performed                Past Medical History:  Diagnosis Date   Arthritis    Chronic back pain    Depression    Fibromyalgia    GERD (gastroesophageal reflux disease)    Hiatal hernia    History of gastritis    Hypertension    followed by pcp   Hypothyroidism    followed by pcp   Mixed hyperlipidemia    SOB (shortness of breath)    per pt gets winded at times   VIN III (vulvar intraepithelial neoplasia III)    Wears glasses    Past Surgical History:  Procedure Laterality Date   ANTERIOR CERVICAL DECOMP/DISCECTOMY FUSION  09-21-2005  '@[facility]'    C5--7   CARPAL TUNNEL RELEASE Bilateral 2006   HEMORROIDECTOMY  2004   LAMINECTOMY AND MICRODISCECTOMY LUMBAR SPINE  06-05-2011  '@[facility]'    L2--3   LAPAROSCOPIC CHOLECYSTECTOMY  2004   LUMBAR FUSION  12-08-2006  '[facility]'    L4--5   LUMBAR SPINE  HARDWARE REMOVAL  10/02/2009  '[facility]'    L4--5   PARTIAL HYSTERECTOMY     POSTERIOR LUMBAR FUSION  06-08-2012  '@[facility]'    re-do  L2--3   ULNAR TUNNEL RELEASE Left 2010   VAGINAL HYSTERECTOMY  12-01-2002  '@[facility]'    VULVECTOMY N/A 07/09/2020   Procedure: WIDE EXCISION VULVECTOMY, BIOPSY OF RIGHT LEG LESION;  Surgeon: Lafonda Mosses, MD;  Location: [facility];  Service: Gynecology;  Laterality: N/A;   Patient Active Problem List   Diagnosis Date Noted   GERD (gastroesophageal reflux disease) 04/10/2021   Dysphagia 04/10/2021   Constipation 04/10/2021   VIN III (vulvar intraepithelial neoplasia III) 06/24/2020   Chronic back pain     REFERRING DIAG: PT eval/tx for M54.40 lum,bago of lumbar region w/sciatica per Kary Kos, MD    THERAPY DIAG:  Chronic left-sided low back pain with left-sided sciatica  Sciatica, right side  Other abnormalities of gait and mobility  PERTINENT HISTORY: 4 lumbar surgeries, 1 cervical surgery, multiple spinal injections, last MRI 5 years ago.    PRECAUTIONS: None   SUBJECTIVE: Patient now has her magnesium so leg cramps are now gone but she had some pain in  her sacral area so took it easy over the weekend. She is not having any issues with her HEP.  PAIN:  Are you having pain? Yes: NPRS scale: 5/10 Pain location: lower back, B but R>L, recent pain pattern change increased R  Pain description: constant, tight, can "lock up" "squeeze tight"  Aggravating factors: movement, prolonged sitting Relieving factors: 15-20 min laying down, a good night sleep   OBJECTIVE: (objective measures completed at initial evaluation unless otherwise dated) Italics from evaluation on 11/13/21   DIAGNOSTIC FINDINGS:  Last MRI 5 years ago   SCREENING FOR RED FLAGS: Bowel or bladder incontinence: No Spinal tumors: No Cauda equina syndrome: No Compression fracture: No Abdominal aneurysm: No   COGNITION:           Overall cognitive status: Within functional  limits for tasks assessed                          SENSATION: Light touch: Impaired    POSTURE:  "Flat back"; decreased lordotic curve   PALPATION: Tender and tight all Lumbar spine paraspinals   LUMBAR ROM:    Active  A/PROM  11/13/2021 % available A/PROM  12/10/2021 % available AROM 12/31/2021 % available  Flexion 70% 100% 100%  Extension 20% 40% 40%  Right lateral flexion 75% 75% 75%  Left lateral flexion 50% 75% 75%  Right rotation 50% 100% 100%  Left rotation 50% 85% 85%   (Blank rows = not tested)     LE MMT:   MMT Right 11/13/2021 Right 12/10/2021 Left 11/13/2021 Left 12/10/2021 Right 12/31/21 Left 12/31/21  Hip flexion 4 5 4- 4    Hip extension   4-   4- 4 4-  Hip abduction 4- 4 4- 4 4+ 4  Hip adduction 4  4     Hip internal rotation          Hip external rotation          Knee flexion 4  4-  4+ 4  Knee extension 4 5 4- '4 5 4  ' Ankle dorsiflexion 4+  4+     Ankle plantarflexion          Ankle inversion          Ankle eversion           (Blank rows = not tested)     FUNCTIONAL TESTS:  5 times sit to stand: 19.08 sec Timed up and go (TUG): 14 sec     TODAY'S TREATMENT  12/31/21 Discharge visit/progress note Review of HEP   2022/01/03 Ab 2024-03-13x 10 Dead bug 2 x 10  Bridge 2 x10 Side lying clam 2 x 10 Quadruped hip extension 2 x 10  SLR with ab brace 2 x 10 each  Birddog x 15 each   12/27/2021 Ab 2024/03/13x 10 Dead bug 2 x 10  Bridge 2 x10 Quadruped hip extension 2 x 10  SLR with ab brace 2 x 10 each  Birddog x 15 each   12/15/21 Abdominal bracing 5' hold 2 x 10 Abdominal bracing with marching  x 20 Abdominal bracing 5" with ball hip Adduction x 20 Abdominal bracing 5" with belt hip Abduction x 20 Glute sets 5" hold x 20 Sciatic nerve glides x15 LLE Bridge x 10  Clamshell x 15 each   12/10/21 Reassess:  Ab brace 15 x5"    Ab march x20    Glute set 10 x 5"  Clamshell x10 each   Sciatic nerve glides x15 LLE   12/09/21 =  Review goals and  HEP  There-Ex =  Supine for HEP review =   all cued for continued breathing/counting - Supine Transversus Abdominis Bracing - Hands on Stomach  -  1 sets - 10 reps - 5 sec hold - Supine Lower Trunk Rotation  - 1 sets - 10 reps - 2-3 sec hold - Supine Hip Adduction Isometric with Ball  - 1 sets - 10 reps - 5 sec hold Supine for abdominal activation increase = observed palpable doming 3 inch wide at superior central abdominal wall  - Supine core activation brace with addition of marching - 2 sets - 10 reps cueing for breathing  Neuro-Re-ed = education conversation as below  Supine = SLR sciatic nerve glide 2 x 30 seconds B - L leg glide pulsing ankle DF/PF; R leg preferred hold tension ankle DF  11/13/21 = Physical therapy evaluation, HEP instruction, plan of care     PATIENT EDUCATION:  Education details: 11/13/21 - HEP, plan of care  12/09/21 - core activation, breath control, abdominal muscle layers, neurodynamics and nerve tension stretching  Person educated: Patient Education method: Consulting civil engineer, Demonstration, Verbal cues, and Handouts Education comprehension: verbalized understanding, returned demonstration, verbal cues required, and needs further education     HOME EXERCISE PROGRAM: 2021/12/18 Dead bug 2 x 10  Bridge 2 x10 Quadruped hip extension 2 x 10  SLR with ab brace 2 x 10 each  Birddog x 15 each   Exercises - Supine Transversus Abdominis Bracing - Hands on Stomach  - 1 x daily - 7 x weekly - 1 sets - 10 reps - 5 sec hold - Supine Lower Trunk Rotation  - 1 x daily - 7 x weekly - 1 sets - 10 reps - 2-3 sec hold - Supine Hip Adduction Isometric with Ball  - 1 x daily - 7 x weekly - 1 sets - 10 reps - 5 sec hold  12/10/21 Ab march Glute set Clamshell Nerve glides   ASSESSMENT:   CLINICAL IMPRESSION: Last approved visit today.  Patient is independent with HEP. Patient with no issues with HEP. She demonstrates improved strength overall but has continued weakness lower  extremities Left lower extremity weakness more than Right lower extremity weakness. Improved lumbar mobility noted but still very limited with lumbar extension. Overall she has progressed but is still limited by pain and weakness than impair her ability to perform daily tasks.   OBJECTIVE IMPAIRMENTS Abnormal gait, decreased activity tolerance, decreased balance, decreased coordination, decreased endurance, decreased mobility, difficulty walking, decreased ROM, decreased strength, hypomobility, impaired perceived functional ability, improper body mechanics, postural dysfunction, and pain.    ACTIVITY LIMITATIONS cleaning, community activity, driving, meal prep, laundry, yard work, and shopping.    PERSONAL FACTORS Fitness and Past/current experiences are also affecting patient's functional outcome.      REHAB POTENTIAL: Good   CLINICAL DECISION MAKING: Stable/uncomplicated   EVALUATION COMPLEXITY: Low     GOALS: Goals reviewed with patient? YES - 12/09/21   SHORT TERM GOALS: Target date: 11/27/2021    Patient with be independent with initial HEP to  improve functional outcomes Baseline: Reports compliance Goal status: MET   2.  Patient will improve 5 x STS score from 22 sec to 20 sec to demonstrate improved functional mobility and increased lower extremity strength.   Baseline: 19 sec Goal status: MET   LONG TERM GOALS: Target date: 12/11/2021   :  Patient will be independent with advanced HEP and self management strategies to improve quality of life and functional outcomes.   Goal status: met   2.  Patient will report at least 50% improvement in overall symptoms and/or function to demonstrate improved functional mobility   Goal status: MET (Reports 50%)   3.  Patient will improve TUG score from 14 sec to 11 sec or better to demonstrate improved functional mobility and decreased fall risk.  Baseline: 10 sec Goal status: MET   4.  Patient will improve all Lower extremity MMT's  measured to 4+/5 or better to improve patients efficiency with navigating steps in home.  Baseline: See MMT Goal status: Partially MET       PLAN: PT FREQUENCY: 2x/week   PT DURATION: 4 weeks   PLANNED INTERVENTIONS: Therapeutic exercises, Therapeutic activity, Neuromuscular re-education, Balance training, Gait training, Patient/Family education, Joint manipulation, Joint mobilization, Stair training, Vestibular training, Aquatic Therapy, Dry Needling, Electrical stimulation, Spinal manipulation, Spinal mobilization, Cryotherapy, Moist heat, scar mobilization, Taping, Traction, Ultrasound, Contrast bath, Biofeedback, and Manual therapy.   PLAN FOR NEXT SESSION: discharge to I HEP; patient has MD appointment tomorrow  with Dr. Saintclair Halsted.    2:18 PM, 12/31/21 Wolfgang Finigan Small Evony Rezek MPT [facility] [place] [phone]

## 2023-09-16 ENCOUNTER — Other Ambulatory Visit (HOSPITAL_COMMUNITY): Payer: Self-pay | Admitting: *Deleted

## 2023-09-16 DIAGNOSIS — Z1231 Encounter for screening mammogram for malignant neoplasm of breast: Secondary | ICD-10-CM

## 2023-09-23 ENCOUNTER — Ambulatory Visit (HOSPITAL_COMMUNITY): Payer: Medicaid Other

## 2023-09-29 ENCOUNTER — Ambulatory Visit (HOSPITAL_COMMUNITY)
Admission: RE | Admit: 2023-09-29 | Discharge: 2023-09-29 | Disposition: A | Discharge status: Home / Self Care | Payer: Medicaid Other | Source: Ambulatory Visit | Attending: *Deleted | Admitting: *Deleted

## 2023-09-29 ENCOUNTER — Encounter (HOSPITAL_COMMUNITY): Payer: Self-pay

## 2023-09-29 DIAGNOSIS — Z1231 Encounter for screening mammogram for malignant neoplasm of breast: Secondary | ICD-10-CM | POA: Insufficient documentation

## 2024-04-21 ENCOUNTER — Encounter (INDEPENDENT_AMBULATORY_CARE_PROVIDER_SITE_OTHER): Payer: Self-pay | Admitting: Otolaryngology

## 2024-04-21 ENCOUNTER — Ambulatory Visit (INDEPENDENT_AMBULATORY_CARE_PROVIDER_SITE_OTHER): Admitting: Otolaryngology

## 2024-04-21 ENCOUNTER — Ambulatory Visit (INDEPENDENT_AMBULATORY_CARE_PROVIDER_SITE_OTHER): Admitting: Audiology

## 2024-04-21 VITALS — BP 121/78 | HR 62 | Ht 66.0 in | Wt 180.0 lb

## 2024-04-21 DIAGNOSIS — H9319 Tinnitus, unspecified ear: Secondary | ICD-10-CM | POA: Diagnosis not present

## 2024-04-21 DIAGNOSIS — H903 Sensorineural hearing loss, bilateral: Secondary | ICD-10-CM | POA: Diagnosis not present

## 2024-04-21 DIAGNOSIS — H9313 Tinnitus, bilateral: Secondary | ICD-10-CM

## 2024-04-21 NOTE — Progress Notes (Signed)
  [address] [place], [place] [address] [phone]  Audiological Evaluation    Name: [name]     DOB:   [birth year]      MRN:   [id number]                                                                                     Service Date: 04/21/2024     Accompanied by: unaccompanied to the booth   Patient comes today after Dr. Karis, ENT sent a referral for a hearing evaluation due to concerns with tinnitus.   Symptoms Yes Details  Hearing loss  [x]  Both ears, notices asks others to repeat what they say often and avoids answering the phone  Tinnitus  [x]  Longstanding, both ears, worse in the left ear - uses a fan and sometimes TV to help her fall asleep  Ear pain/ infections/pressure  []    Balance problems  []    Noise exposure history  [x]  Loud music, used to be a Museum/gallery exhibitions officer, car racings, concerts  Previous ear surgeries  []    Family history of hearing loss  []    Amplification  []    Other  []      Otoscopy: Right ear: Clear external ear canal and notable landmarks visualized on the tympanic membrane. Left ear:  Clear external ear canal and notable landmarks visualized on the tympanic membrane.  Tympanometry: Right ear: Type A- Normal external ear canal volume with normal middle ear pressure and tympanic membrane compliance. Left ear: Type A- Normal external ear canal volume with normal middle ear pressure and tympanic membrane compliance.    Pure tone Audiometry: Right ear- Normal hearing from [phone] Hz, then mild  sensorineural hearing loss from 4000 Hz - 6000 Hz, then borderline normal at 8000 Hz. Left ear-  Normal hearing from [phone] Hz, then mild sensorineural hearing loss from 4000 Hz - 8000 Hz.  Speech Audiometry: Right ear- Speech Reception Threshold (SRT) was obtained at 10 dBHL. Left ear-Speech Reception Threshold (SRT) was obtained at 10 dBHL.   Word Recognition Score Tested using NU-6 (recorded) Right ear: 100% was  obtained at a presentation level of 60 dBHL with contralateral masking which is deemed as  excellent. Left ear: 100% was obtained at a presentation level of 60 dBHL with contralateral masking which is deemed as  excellent.   The hearing test results were completed under headphones and results are deemed to be of good reliability. Test technique:  conventional     Recommendations: Follow up with ENT as scheduled for today. Return for a hearing evaluation if concerns with hearing changes arise or per MD recommendation. Consider various tinnitus strategies, including the use of a sound generator, hearing aids, and/or tinnitus retraining therapy.  Consider a communication needs assessment after medical clearance for hearing aids is obtained.   Ismeal Heider MARIE LEROUX-MARTINEZ, AUD

## 2024-04-23 DIAGNOSIS — H903 Sensorineural hearing loss, bilateral: Secondary | ICD-10-CM | POA: Insufficient documentation

## 2024-04-23 DIAGNOSIS — H9313 Tinnitus, bilateral: Secondary | ICD-10-CM | POA: Insufficient documentation

## 2024-04-23 NOTE — Progress Notes (Signed)
 CC: Bilateral tinnitus  HPI:  [name] is a 58 y.o. female who presents today complaining of bilateral tinnitus for the past 5+ years.  She describes the tinnitus as a high-pitched ringing noise.  She has a history of loud noise exposure, secondary to attending multiple loud concerts and NAS[employer] events.  She denies any recent otitis media or otitis externa.  She has no previous otologic surgery.  She has never worn hearing aids.  Currently she denies any otalgia, otorrhea, or vertigo.  Past Medical History:  Diagnosis Date   Arthritis    Chronic back pain    Depression    Fibromyalgia    GERD (gastroesophageal reflux disease)    Hiatal hernia    History of gastritis    Hypertension    followed by pcp   Hypothyroidism    followed by pcp   Mixed hyperlipidemia    SOB (shortness of breath)    per pt gets winded at times   VIN III (vulvar intraepithelial neoplasia III)    Wears glasses     Past Surgical History:  Procedure Laterality Date   ANTERIOR CERVICAL DECOMP/DISCECTOMY FUSION  09-21-2005  @[facility]    C5--7   CARPAL TUNNEL RELEASE Bilateral 2006   HEMORROIDECTOMY  2004   LAMINECTOMY AND MICRODISCECTOMY LUMBAR SPINE  06-05-2011  @[facility]    L2--3   LAPAROSCOPIC CHOLECYSTECTOMY  2004   LUMBAR FUSION  12-08-2006  @[facility]    L4--5   LUMBAR SPINE HARDWARE REMOVAL  10/02/2009  @[facility]    L4--5   PARTIAL HYSTERECTOMY     POSTERIOR LUMBAR FUSION  06-08-2012  @[facility]    re-do  L2--3   ULNAR TUNNEL RELEASE Left 2010   VAGINAL HYSTERECTOMY  12-01-2002  @[facility]    VULVECTOMY N/A 07/09/2020   Procedure: WIDE EXCISION VULVECTOMY, BIOPSY OF RIGHT LEG LESION;  Surgeon: Viktoria Comer SAUNDERS, MD;  Location: [facility];  Service: Gynecology;  Laterality: N/A;    Family History  Problem Relation Age of Onset   Ovarian cancer Mother    Skin cancer Mother    Bladder Cancer Father    Leukemia Sister    Breast cancer Neg Hx    Colon cancer Neg Hx     Social History:  reports that she quit  smoking about 5 years ago. Her smoking use included cigarettes. She started smoking about 25 years ago. She has a 20 pack-year smoking history. She has never used smokeless tobacco. She reports current alcohol use. She reports that she does not use drugs.  Allergies:  Allergies  Allergen Reactions   Hydrocodone  Other (See Comments)    hyper   Codeine     Hyper, anxious, can't sleep   Ibuprofen  Other (See Comments)    GI upset   Lisinopril Cough    Sore throat   Morphine Itching    Awake    Prior to Admission medications   Medication Sig Start Date End Date Taking? Authorizing Provider  amLODipine (NORVASC) 5 MG tablet Take 5 mg by mouth daily.   Yes [provider]  EUTHYROX 75 MCG tablet Take 75 mcg by mouth daily. 06/05/20  Yes [provider]  famotidine (PEPCID) 20 MG tablet Take 20 mg by mouth 2 (two) times daily.   Yes [provider]  Magnesium  200 MG TABS Take 200 mg by mouth daily. Per pt takes for nerve pain   Yes [provider]  methocarbamol (ROBAXIN) 500 MG tablet Take 500 mg by mouth 2 (two) times daily  as needed. 09/16/21  Yes [provider]  metoprolol tartrate (LOPRESSOR) 25 MG tablet Take 25 mg by mouth 2 (two) times daily. 06/04/20  Yes [provider]  milk thistle 175 MG tablet Take 175 mg by mouth 2 (two) times daily.   Yes [provider]  Multiple Vitamin (MULTIVITAMIN) capsule Take 1 capsule by mouth daily. Gummie   Yes [provider]  NAPROXEN PO Take 440 mg by mouth daily as needed for headache.   Yes [provider]  Omega-3 1000 MG CAPS Take 2,000 mg by mouth 2 (two) times daily.   Yes [provider]  polyethylene glycol-electrolytes (TRILYTE) 420 g solution Take 4,000 mLs by mouth as directed. 04/17/21  Yes Eartha Angelia Sieving, MD  sodium chloride  (OCEAN) 0.65 % nasal spray Place 2 sprays into the nose daily as needed for congestion. For nasal congestion   Yes  [provider]  Tetrahydroz-Polyvinyl Al-Povid (EYE IRRITATION RELIEF) 0.05-0.5-0.6 % SOLN Place 1 drop into both eyes daily as needed (itching eyes). Saline   Yes [provider]  TURMERIC PO Take 1,500 mg by mouth daily at 6 (six) AM.   Yes [provider]  conjugated estrogens  (PREMARIN ) vaginal cream Place 1 Applicatorful vaginally 3 (three) times a week. Patient not taking: Reported on 04/21/2024 09/20/20   Viktoria Comer SAUNDERS, MD    Blood pressure 121/78, pulse 62, height 5' 6 (1.676 m), weight 180 lb (81.6 kg), SpO2 98%. Exam: General: Communicates without difficulty, well nourished, no acute distress. Head: Normocephalic, no evidence injury, no tenderness, facial buttresses intact without stepoff. Face/sinus: No tenderness to palpation and percussion. Facial movement is normal and symmetric. Eyes: PERRL, EOMI. No scleral icterus, conjunctivae clear. Neuro: CN II exam reveals vision grossly intact.  No nystagmus at any point of gaze. Ears: Auricles well formed without lesions.  Ear canals are intact without mass or lesion.  No erythema or edema is appreciated.  The TMs are intact without fluid. Nose: External evaluation reveals normal support and skin without lesions.  Dorsum is intact.  Anterior rhinoscopy reveals normal mucosa over anterior aspect of inferior turbinates and intact septum.  No purulence noted. Oral:  Oral cavity and oropharynx are intact, symmetric, without erythema or edema.  Mucosa is moist without lesions. Neck: Full range of motion without pain.  There is no significant lymphadenopathy.  No masses palpable.  Thyroid bed within normal limits to palpation.  Parotid glands and submandibular glands equal bilaterally without mass.  Trachea is midline. Neuro:  CN 2-12 grossly intact.   Her hearing test shows bilateral high-frequency sensorineural hearing loss.  Assessment: 1.  Bilateral high-frequency sensorineural hearing loss. 2.  Her tinnitus is  likely a direct result of her hearing loss. 3.  Her ear canals, tympanic membranes, and middle ear spaces are all normal.  Plan: 1.  The physical exam findings and the hearing test results are reviewed with the patient. 2.  The strategies to cope with tinnitus, including the use of masker, hearing aids, tinnitus retraining therapy, and avoidance of caffeine and alcohol are discussed.  3.  The patient is a candidate for hearing amplification. 4.  The patient will return for reevaluation in 1 year, sooner if needed.  Antavious Spanos W Charmain Diosdado 04/23/2024, 2:43 PM
